# Patient Record
Sex: Female | Born: 1950
Health system: Southern US, Community
[De-identification: ages and names within clinical notes are randomized; demographics above are authoritative.]

## PROBLEM LIST (undated history)

## (undated) DIAGNOSIS — K5790 Diverticulosis of intestine, part unspecified, without perforation or abscess without bleeding: Secondary | ICD-10-CM

## (undated) DIAGNOSIS — R51 Headache: Secondary | ICD-10-CM

## (undated) DIAGNOSIS — Z972 Presence of dental prosthetic device (complete) (partial): Secondary | ICD-10-CM

## (undated) DIAGNOSIS — K219 Gastro-esophageal reflux disease without esophagitis: Secondary | ICD-10-CM

## (undated) DIAGNOSIS — B019 Varicella without complication: Secondary | ICD-10-CM

## (undated) DIAGNOSIS — E785 Hyperlipidemia, unspecified: Secondary | ICD-10-CM

## (undated) DIAGNOSIS — R42 Dizziness and giddiness: Secondary | ICD-10-CM

## (undated) DIAGNOSIS — M1990 Unspecified osteoarthritis, unspecified site: Secondary | ICD-10-CM

## (undated) DIAGNOSIS — I10 Essential (primary) hypertension: Secondary | ICD-10-CM

## (undated) HISTORY — DX: Hyperlipidemia, unspecified: E78.5

## (undated) HISTORY — DX: Headache: R51

## (undated) HISTORY — DX: Gastro-esophageal reflux disease without esophagitis: K21.9

## (undated) HISTORY — PX: OTHER SURGICAL HISTORY: SHX169

## (undated) HISTORY — DX: Essential (primary) hypertension: I10

## (undated) HISTORY — DX: Unspecified osteoarthritis, unspecified site: M19.90

## (undated) HISTORY — PX: BUNIONECTOMY: SHX129

## (undated) HISTORY — DX: Varicella without complication: B01.9

---

## 1997-08-02 HISTORY — PX: ABDOMINAL HYSTERECTOMY: SHX81

## 2002-09-01 HISTORY — PX: HAND SURGERY: SHX662

## 2002-09-18 LAB — HM COLONOSCOPY: HM Colonoscopy: NORMAL

## 2004-11-16 ENCOUNTER — Ambulatory Visit: Payer: Self-pay | Admitting: Unknown Physician Specialty

## 2005-04-23 ENCOUNTER — Other Ambulatory Visit: Payer: Self-pay

## 2005-04-27 ENCOUNTER — Ambulatory Visit: Payer: Self-pay | Admitting: Podiatry

## 2005-06-27 ENCOUNTER — Ambulatory Visit: Payer: Self-pay | Admitting: Unknown Physician Specialty

## 2005-11-20 ENCOUNTER — Ambulatory Visit: Payer: Self-pay | Admitting: Unknown Physician Specialty

## 2005-12-19 ENCOUNTER — Ambulatory Visit: Payer: Self-pay | Admitting: Unknown Physician Specialty

## 2007-02-05 ENCOUNTER — Ambulatory Visit: Payer: Self-pay | Admitting: Unknown Physician Specialty

## 2008-02-11 ENCOUNTER — Ambulatory Visit: Payer: Self-pay | Admitting: Unknown Physician Specialty

## 2009-02-15 ENCOUNTER — Ambulatory Visit: Payer: Self-pay | Admitting: Unknown Physician Specialty

## 2010-08-24 ENCOUNTER — Ambulatory Visit: Payer: Self-pay | Admitting: Unknown Physician Specialty

## 2011-10-16 ENCOUNTER — Ambulatory Visit: Payer: Self-pay | Admitting: Internal Medicine

## 2011-11-16 ENCOUNTER — Encounter: Payer: Self-pay | Admitting: Internal Medicine

## 2011-11-16 ENCOUNTER — Ambulatory Visit (INDEPENDENT_AMBULATORY_CARE_PROVIDER_SITE_OTHER): Payer: No Typology Code available for payment source | Admitting: Internal Medicine

## 2011-11-16 ENCOUNTER — Other Ambulatory Visit (HOSPITAL_COMMUNITY)
Admission: RE | Admit: 2011-11-16 | Discharge: 2011-11-16 | Disposition: A | Discharge status: Home / Self Care | Payer: No Typology Code available for payment source | Source: Ambulatory Visit | Attending: Internal Medicine | Admitting: Internal Medicine

## 2011-11-16 VITALS — BP 142/100 | HR 74 | Temp 98.7°F | Ht 64.75 in | Wt 191.8 lb

## 2011-11-16 DIAGNOSIS — Z1239 Encounter for other screening for malignant neoplasm of breast: Secondary | ICD-10-CM

## 2011-11-16 DIAGNOSIS — D126 Benign neoplasm of colon, unspecified: Secondary | ICD-10-CM

## 2011-11-16 DIAGNOSIS — Z01419 Encounter for gynecological examination (general) (routine) without abnormal findings: Secondary | ICD-10-CM | POA: Insufficient documentation

## 2011-11-16 DIAGNOSIS — Z1151 Encounter for screening for human papillomavirus (HPV): Secondary | ICD-10-CM | POA: Insufficient documentation

## 2011-11-16 DIAGNOSIS — E669 Obesity, unspecified: Secondary | ICD-10-CM

## 2011-11-16 DIAGNOSIS — I10 Essential (primary) hypertension: Secondary | ICD-10-CM

## 2011-11-16 DIAGNOSIS — L259 Unspecified contact dermatitis, unspecified cause: Secondary | ICD-10-CM | POA: Insufficient documentation

## 2011-11-16 DIAGNOSIS — Z0000 Encounter for general adult medical examination without abnormal findings: Secondary | ICD-10-CM

## 2011-11-16 DIAGNOSIS — Z124 Encounter for screening for malignant neoplasm of cervix: Secondary | ICD-10-CM

## 2011-11-16 DIAGNOSIS — K635 Polyp of colon: Secondary | ICD-10-CM | POA: Insufficient documentation

## 2011-11-16 DIAGNOSIS — N760 Acute vaginitis: Secondary | ICD-10-CM | POA: Insufficient documentation

## 2011-11-16 LAB — MICROALBUMIN / CREATININE URINE RATIO: Creatinine,U: 59.8 mg/dL

## 2011-11-16 MED ORDER — LOSARTAN POTASSIUM-HCTZ 50-12.5 MG PO TABS: 1.0000 | ORAL_TABLET | Freq: Every day | ORAL | Status: DC

## 2011-11-16 MED ORDER — TRIAMTERENE-HCTZ 37.5-25 MG PO CAPS: 1.0000 | ORAL_CAPSULE | ORAL | Status: DC

## 2011-11-16 NOTE — Progress Notes (Signed)
Patient ID: Christina Conway, female   DOB: 1950/09/25, 61 y.o.   MRN: [id number]   Subjective:    Christina Conway is a 61 y.o. female who presents for an annual exam. The patient has several complaints today. 1)She had a  right foot bone fracture June 21 occurring at work after tripping on curb.   2)  History of hypertension.  No history of adverse reactions to medications.   3)   Obesity.  She has recently started a Weight Watchers program and has lost 6 pounds in her first week of compliance. 4) new onset pruritic rash behind both ears.   The patient is sexually active. GYN screening history: last pap: was normal and approximate date 2011 and was normal. The patient wears seatbelts: yes. The patient participates in regular exercise: no. Has the patient ever been transfused or tattooed?: no. The patient reports that there is not domestic violence in her life.   Menstrual History: OB History    Grav Para Term Preterm Abortions TAB SAB Ect Mult Living                  Menarche age: 50 No LMP recorded. Patient has had a hysterectomy.    The following portions of the patient's history were reviewed and updated as appropriate: allergies, current medications, past family history, past medical history, past social history, past surgical history and problem list.  Review of Systems A comprehensive review of systems was negative except for: Integument/breast: positive for pruritus and rash    Objective:    BP 142/100  Pulse 74  Temp 98.7 F (37.1 C) (Oral)  Ht 5' 4.75" (1.645 m)  Wt 191 lb 12 oz (86.977 kg)  BMI 32.16 kg/m2  SpO2 98%  General Appearance:    Alert, cooperative, no distress, appears stated age  Head:    Normocephalic, without obvious abnormality, atraumatic  Eyes:    PERRL, conjunctiva/corneas clear, EOM's intact, fundi    benign, both eyes  Ears:    Normal TM's and external ear canals, both ears  Nose:   Nares normal, septum midline, mucosa normal, no drainage    or sinus tenderness    Throat:   Lips, mucosa, and tongue normal; teeth and gums normal  Neck:   Supple, symmetrical, trachea midline, no adenopathy;    thyroid:  no enlargement/tenderness/nodules; no carotid   bruit or JVD  Back:     Symmetric, no curvature, ROM normal, no CVA tenderness  Lungs:     Clear to auscultation bilaterally, respirations unlabored  Chest Wall:    No tenderness or deformity   Heart:    Regular rate and rhythm, S1 and S2 normal, no murmur, rub   or gallop  Breast Exam:    No tenderness, masses, or nipple abnormality  Abdomen:     Soft, non-tender, bowel sounds active all four quadrants,    no masses, no organomegaly  Genitalia:    Normal female without lesion, discharge or tenderness     Extremities:   Extremities normal, atraumatic, no cyanosis or edema  Pulses:   2+ and symmetric all extremities  Skin:   Scaling erythematous patch behind both ears, in contact with earring.   Lymph nodes:   Cervical, supraclavicular, and axillary nodes normal  Neurologic:   CNII-XII intact, normal strength, sensation and reflexes    throughout  .    Assessment:   Contact dermatitis Her rash is consistent with contact dermatitis do to contact with her earrings. Recommended  suspended using any jewelry that is not hypoallergenic and applying cortisone cream twice daily to rash until gone.  Hypertension Not well controlled on current medication. I am switching her to losartan/HCTZ. She will return in one week for basic metabolic panel and other labs.  Routine general medical examination at a health care facility Breast and pelvic were done today. She is status post hysterectomy. Exam was normal. Mammogram ordered.  Obesity (BMI 30-39.9) I have addressed  BMI and recommended a low glycemic index diet utilizing smaller more frequent meals to increase metabolism.  I have also recommended that patient start exercising with a goal of 30 minutes of aerobic exercise a minimum of 5 days per week. Screening  for lipid disorders, thyroid and diabetes to be done next week. She is already lost 6 pounds using Weight Watchers regimen.    Updated Medication List Outpatient Encounter Prescriptions as of 11/16/2011  Medication Sig Dispense Refill  . ALPRAZolam (XANAX) 0.25 MG tablet Take 0.25 mg by mouth at bedtime as needed.      Marland Kitchen losartan-hydrochlorothiazide (HYZAAR) 50-12.5 MG per tablet Take 1 tablet by mouth daily.  90 tablet  3  . DISCONTD: triamterene-hydrochlorothiazide (DYAZIDE) 37.5-25 MG per capsule Take 1 capsule by mouth every morning.      Marland Kitchen DISCONTD: triamterene-hydrochlorothiazide (DYAZIDE) 37.5-25 MG per capsule Take 1 each (1 capsule total) by mouth every morning.  30 capsule  5

## 2011-11-16 NOTE — Patient Instructions (Addendum)
I am witching your bp medication to losartan/hct.  Return in one week for fasting labs  Mammogram to be scheduled  Will will arrange for your repeat colonoscopy with dr Mechele Collin

## 2011-11-18 DIAGNOSIS — E669 Obesity, unspecified: Secondary | ICD-10-CM | POA: Insufficient documentation

## 2011-11-18 NOTE — Assessment & Plan Note (Signed)
I have addressed  BMI and recommended a low glycemic index diet utilizing smaller more frequent meals to increase metabolism.  I have also recommended that patient start exercising with a goal of 30 minutes of aerobic exercise a minimum of 5 days per week. Screening for lipid disorders, thyroid and diabetes to be done next week. She is already lost 6 pounds using Weight Watchers regimen.

## 2011-11-18 NOTE — Assessment & Plan Note (Signed)
Not well controlled on current medication. I am switching her to losartan/HCTZ. She will return in one week for basic metabolic panel and other labs.

## 2011-11-18 NOTE — Assessment & Plan Note (Signed)
Her rash is consistent with contact dermatitis do to contact with her earrings. Recommended suspended using any jewelry that is not hypoallergenic and applying cortisone cream twice daily to rash until gone.

## 2011-11-18 NOTE — Assessment & Plan Note (Addendum)
Breast and pelvic were done today. She is status post hysterectomy. Exam was normal. Mammogram ordered.

## 2011-11-23 ENCOUNTER — Telehealth: Payer: Self-pay | Admitting: *Deleted

## 2011-11-23 ENCOUNTER — Other Ambulatory Visit (INDEPENDENT_AMBULATORY_CARE_PROVIDER_SITE_OTHER): Payer: No Typology Code available for payment source | Admitting: *Deleted

## 2011-11-23 DIAGNOSIS — I10 Essential (primary) hypertension: Secondary | ICD-10-CM

## 2011-11-23 DIAGNOSIS — E785 Hyperlipidemia, unspecified: Secondary | ICD-10-CM

## 2011-11-23 DIAGNOSIS — Z79899 Other long term (current) drug therapy: Secondary | ICD-10-CM

## 2011-11-23 LAB — HEMOGLOBIN A1C: Hgb A1c MFr Bld: 5.7 % (ref 4.6–6.5)

## 2011-11-23 LAB — COMPREHENSIVE METABOLIC PANEL
AST: 25 U/L (ref 0–37)
Alkaline Phosphatase: 78 U/L (ref 39–117)
Glucose, Bld: 80 mg/dL (ref 70–99)
Sodium: 138 mEq/L (ref 135–145)
Total Bilirubin: 0.6 mg/dL (ref 0.3–1.2)
Total Protein: 7.3 g/dL (ref 6.0–8.3)

## 2011-11-23 LAB — LIPID PANEL
Cholesterol: 206 mg/dL — ABNORMAL HIGH (ref 0–200)
HDL: 49.8 mg/dL (ref >39.00)
Total CHOL/HDL Ratio: 4
VLDL: 46.8 mg/dL — ABNORMAL HIGH (ref 0.0–40.0)

## 2011-11-23 LAB — TSH: TSH: 1.24 u[IU]/mL (ref 0.35–5.50)

## 2011-11-23 NOTE — Telephone Encounter (Signed)
Patient came in for labs today, what do you want me to order?

## 2011-11-23 NOTE — Telephone Encounter (Signed)
FAsting lipids, TSH,.  CMEt,  And Hgba1c

## 2011-11-29 ENCOUNTER — Other Ambulatory Visit: Payer: Self-pay | Admitting: Internal Medicine

## 2011-11-29 MED ORDER — POTASSIUM CHLORIDE ER 10 MEQ PO TBCR: 10.0000 meq | EXTENDED_RELEASE_TABLET | Freq: Every day | ORAL | Status: DC

## 2011-12-04 ENCOUNTER — Encounter: Payer: Self-pay | Admitting: Internal Medicine

## 2011-12-06 ENCOUNTER — Telehealth: Payer: Self-pay | Admitting: Internal Medicine

## 2011-12-06 ENCOUNTER — Emergency Department: Payer: Self-pay | Admitting: Unknown Physician Specialty

## 2011-12-06 NOTE — Telephone Encounter (Signed)
Pt called wanting an appointment as soon as possible she has a boil on her private area.  She said it started yesterday.  She said today it is bigger and has a purple head and very painful Appointment Friday 8/23 @ 2:45.

## 2011-12-06 NOTE — Telephone Encounter (Signed)
Pt called back she is going to go to urgent care.  She stated she could not wait until tomorrow

## 2011-12-07 ENCOUNTER — Ambulatory Visit: Payer: No Typology Code available for payment source | Admitting: Internal Medicine

## 2012-03-11 ENCOUNTER — Telehealth: Payer: Self-pay | Admitting: Internal Medicine

## 2012-03-11 NOTE — Telephone Encounter (Signed)
Caller: Alleah/Patient; Phone: [phone]; Reason for Call: Has billing question.

## 2012-03-12 NOTE — Telephone Encounter (Signed)
Received voice mail from patient asking me to look into her bill for August 2nd and 9th. I have emailed Anabell to see if they were coded correctly or not. I also called patient to advise her that I had sent email.

## 2012-05-07 NOTE — Telephone Encounter (Signed)
I have sent another email to Christina Conway asking her for the status of this patient's bill since it still looks as if nothing has been done.

## 2012-05-14 ENCOUNTER — Other Ambulatory Visit: Payer: Self-pay | Admitting: Internal Medicine

## 2012-05-19 ENCOUNTER — Ambulatory Visit: Payer: No Typology Code available for payment source | Admitting: Internal Medicine

## 2012-05-21 ENCOUNTER — Encounter: Payer: Self-pay | Admitting: Internal Medicine

## 2012-05-21 ENCOUNTER — Ambulatory Visit (INDEPENDENT_AMBULATORY_CARE_PROVIDER_SITE_OTHER): Payer: No Typology Code available for payment source | Admitting: Internal Medicine

## 2012-05-21 VITALS — BP 128/84 | HR 77 | Temp 98.0°F | Resp 16 | Wt 195.5 lb

## 2012-05-21 DIAGNOSIS — Z79899 Other long term (current) drug therapy: Secondary | ICD-10-CM

## 2012-05-21 DIAGNOSIS — I10 Essential (primary) hypertension: Secondary | ICD-10-CM

## 2012-05-21 DIAGNOSIS — D126 Benign neoplasm of colon, unspecified: Secondary | ICD-10-CM

## 2012-05-21 DIAGNOSIS — E669 Obesity, unspecified: Secondary | ICD-10-CM

## 2012-05-21 DIAGNOSIS — K635 Polyp of colon: Secondary | ICD-10-CM

## 2012-05-21 DIAGNOSIS — Z1211 Encounter for screening for malignant neoplasm of colon: Secondary | ICD-10-CM

## 2012-05-21 LAB — BASIC METABOLIC PANEL
BUN: 27 mg/dL — ABNORMAL HIGH (ref 6–23)
Chloride: 102 mEq/L (ref 96–112)
Potassium: 3.9 mEq/L (ref 3.5–5.1)

## 2012-05-21 NOTE — Progress Notes (Addendum)
Patient ID: Christina Conway, female   DOB: November 27, 1950, 62 y.o.   MRN: [id number]   Patient Active Problem List  Diagnosis  . Routine general medical examination at a health care facility  . Hypertension  . Contact dermatitis  . Benign colonic polyp  . Obesity (BMI 30-39.9)    Subjective:  CC:   Chief Complaint  Patient presents with  . Follow-up    HPI:   Christina Conway a 62 y.o. female who presents 6 month follow up on hypertension and obesity .  She feels fine, is taking her medications as instructed and is having no side effects. She has gained weight over the holidays and is not exercising or following the Weight Watchers program. She denies chest pain, shortness of breath and fatigue.      Past Medical History  Diagnosis Date  . Arthritis   . Chicken pox   . Headache   . Hypertension     Past Surgical History  Procedure Date  . Bunionectomy 06/03/1998 & 06/04/2003  . Hand surgery 09/01/2002    righ hand  . Abdominal hysterectomy 08/02/1997  . Bunion          The following portions of the patient's history were reviewed and updated as appropriate: Allergies, current medications, and problem list.    Review of Systems:   12 Pt  review of systems was negative except those addressed in the HPI,     History   Social History  . Marital Status: Married    Spouse Name: N/A    Number of Children: N/A  . Years of Education: N/A   Occupational History  . Not on file.   Social History Main Topics  . Smoking status: Never Smoker   . Smokeless tobacco: Not on file  . Alcohol Use: No  . Drug Use: No  . Sexually Active: Not on file   Other Topics Concern  . Not on file   Social History Narrative  . No narrative on file    Objective:  BP 128/84  Pulse 77  Temp 98 F (36.7 C) (Oral)  Resp 16  Wt 195 lb 8 oz (88.678 kg)  SpO2 99%  General appearance: alert, cooperative and appears stated age Ears: normal TM's and external ear canals both  ears Throat: lips, mucosa, and tongue normal; teeth and gums normal Neck: no adenopathy, no carotid bruit, supple, symmetrical, trachea midline and thyroid not enlarged, symmetric, no tenderness/mass/nodules Back: symmetric, no curvature. ROM normal. No CVA tenderness. Lungs: clear to auscultation bilaterally Heart: regular rate and rhythm, S1, S2 normal, no murmur, click, rub or gallop Abdomen: soft, non-tender; bowel sounds normal; no masses,  no organomegaly Pulses: 2+ and symmetric Skin: Skin color, texture, turgor normal. No rashes or lesions Lymph nodes: Cervical, supraclavicular, and axillary nodes normal.  Assessment and Plan:  Hypertension Well controlled on current regimen. Renal function stable, no changes today.  Obesity (BMI 30-39.9) I have addressed  BMI and recommended a low glycemic index diet utilizing smaller more frequent meals to increase metabolism.  I have also recommended that patient start exercising with a goal of 30 minutes of aerobic exercise a minimum of 5 days per week.  25 minutes was spent in face to face time, over 50% of which was spent in counseling .    Benign colonic polyp She is due for  follow up colonoscopy. With Dr. Mechele Collin.  Referral made.     Updated Medication List Outpatient Encounter Prescriptions as of 05/21/2012  Medication Sig Dispense Refill  . losartan-hydrochlorothiazide (HYZAAR) 50-12.5 MG per tablet Take 1 tablet by mouth daily.  90 tablet  3  . triamterene-hydrochlorothiazide (DYAZIDE) 37.5-25 MG per capsule TAKE 1 EACH (1 CAPSULE TOTAL) BY MOUTH EVERY MORNING.  30 capsule  5  . ALPRAZolam (XANAX) 0.25 MG tablet Take 0.25 mg by mouth at bedtime as needed.      . potassium chloride (K-DUR) 10 MEQ tablet Take 1 tablet (10 mEq total) by mouth daily.  30 tablet  3

## 2012-05-21 NOTE — Assessment & Plan Note (Signed)
She is due for  follow up colonoscopy. With Dr. Mechele Collin.  Referral made.

## 2012-05-21 NOTE — Patient Instructions (Addendum)
Return in 6 month for your annual physical with fasting labs prior or same day  Referral for colonoscopy underway  This is  my version of a  "Low GI"  Diet:  It is not ultra low carb, but will still lower your blood sugars and allow you to lose 5 to 10 lbs per month if you follow it carefully. All of the foods can be found at grocery stores and in bulk at [employer].  The Atkins protein bars and shakes are available in more varieties at [employer], [employer] and [employer].     7 AM Breakfast:  Low carbohydrate Protein  Shakes (I recommend the [employer] AdvantEdge "Carb Control" shakes  Or the low carb shakes by Atkins.   Both are available everywhere:  In  cases at [employer]  Or in 4 packs at grocery stores and pharmacies  2.5 carbs  (Alternative is  a toasted Arnold's Sandwhich Thin w/ peanut butter, a "Bagel Thin" with cream cheese and salmon) or  a scrambled egg burrito made with a low carb tortilla .  Avoid cereal and bananas, oatmeal too unless you are cooking the old fashioned kind that takes 30-40 minutes to prepare.  the rest is overly processed, has minimal fiber, and is loaded with carbohydrates!   10 AM: Protein bar by Atkins (the snack size, under 200 cal).  There are many varieties , available widely again or in bulk in limited varieties at [employer]Js)  Other so called "protein bars" tend to be loaded with carbohydrates.  Remember, in food advertising, the word "energy" is synonymous for " carbohydrate."  Lunch: sandwich of [place], (or any lunchmeat, grilled meat or canned tuna), fresh avocado, mayonnaise  and cheese on a lower carbohydrate pita bread, flatbread, or tortilla . Ok to use regular mayonnaise. The bread is the only source or carbohydrate that can be decreased (Joseph's makes a pita bread and a flat bread that are 50 cal and 4 net carbs ; Toufayan makes a low carb flatbread that's 100 cal and 9 net carbs  and  [place] makes a low carb whole wheat tortilla  That is 210 cal and 6 net carbs)  3 PM:   Mid day :  Another protein bar,  Or a  cheese stick (100 cal, 0 carbs),  Or 1 ounce of  almonds, walnuts, pistachios, pecans, peanuts,  Macadamia nuts. Or a Dannon light n Fit greek yogurt, 80 cal 8 net carbs . Avoid "granola"; the dried cranberries and raisins are loaded with carbohydrates. Mixed nuts ok if no raisins or cranberries or dried fruit.      6 PM  Dinner:  "mean and green:"  Meat/chicken/fish or a high protein legume; , with a green salad, and a low GI  Veggie (broccoli, cauliflower, green beans, spinach, brussel sprouts. Lima beans) : Avoid "Low fat dressings, as well as Reyne Dumas and [address]! They are loaded with sugar! Instead use ranch, vinagrette,  Blue cheese, etc.  There is a low carb pasta by Dreamfield's available at [facility] that is acceptable and tastes great. Try Michel Angel's chicken piccata over low carb pasta. The chicken dish is 0 carbs, and can be found in frozen section at [employer]Js and [employer]. Also try [employer]" (pulled pork, no sauce,  0 carbs) and his pot roast.   both are in the refrigerated section at [employer]Js   Dreamfield's makes a low carb pasta only 5 g/serving.  Available at all grocery stores,  And tastes like normal pasta  9 PM snack : Breyer's "low carb" fudgsicle or  ice cream bar (Carb Smart line), or  Weight Watcher's ice cream bar , or another "no sugar added" ice cream;a serving of fresh berries/cherries with whipped cream (Avoid bananas, pineapple, grapes  and watermelon on a regular basis because they are high in sugar)   Remember that snack Substitutions should be less than 10 carbs per serving and meals < 20 carbs. Remember to subtract fiber grams and sugar alcohols to get the "net carbs."

## 2012-05-21 NOTE — Assessment & Plan Note (Signed)
Well controlled on current regimen. Renal function stable, no changes today. 

## 2012-05-21 NOTE — Assessment & Plan Note (Signed)
I have addressed  BMI and recommended a low glycemic index diet utilizing smaller more frequent meals to increase metabolism.  I have also recommended that patient start exercising with a goal of 30 minutes of aerobic exercise a minimum of 5 days per week.  25 minutes was spent in face to face time, over 50% of which was spent in counseling .

## 2012-05-26 ENCOUNTER — Encounter: Payer: Self-pay | Admitting: Internal Medicine

## 2012-05-27 NOTE — Telephone Encounter (Signed)
Have you received any information about this colonoscopy?

## 2012-05-31 ENCOUNTER — Other Ambulatory Visit: Payer: Self-pay

## 2012-06-19 ENCOUNTER — Encounter: Payer: Self-pay | Admitting: Internal Medicine

## 2012-08-29 ENCOUNTER — Other Ambulatory Visit: Payer: Self-pay | Admitting: Internal Medicine

## 2012-08-29 NOTE — Telephone Encounter (Signed)
Pharmacy sent refill for Triam/HCTZ. I called pt to clarify what BP med she has been taking. In OV 11/2011, note mentioned changing from Triam/HCTZ to Losartan HCT. Pt states she has been taking both since that visit. Is this what you would like her to be doing, pt requests call back to clarify. States has had no issues taking both meds, no dizziness.

## 2012-09-03 NOTE — Telephone Encounter (Signed)
Pt called, wanting clarification on her blood pressure medicines. She had requested refill for Triam/HCTZ. When I looked at your 8/13 note, you stated she was switching to Losartan HCT and Triam/HCTZ was discontinued. However, pt has been taking both since that time. Which medicine should she be taking, or both?

## 2012-09-04 ENCOUNTER — Other Ambulatory Visit: Payer: Self-pay | Admitting: *Deleted

## 2012-09-04 NOTE — Telephone Encounter (Signed)
Called and advised pt that she should only be taking Losartan HCT and to discontinue the Triam/HCTZ, verbalized understanding.

## 2012-09-15 ENCOUNTER — Telehealth: Payer: Self-pay | Admitting: *Deleted

## 2012-09-15 MED ORDER — LOSARTAN POTASSIUM-HCTZ 50-12.5 MG PO TABS: 2.0000 | ORAL_TABLET | Freq: Every day | ORAL | Status: DC

## 2012-09-15 NOTE — Telephone Encounter (Signed)
Patient reports called office two weeks ago and was told to stop Triamterene -HCTZ 37.5 since then patient report BP increase to 157/97. Patient is concerned should she take the Triamterene- hctz since BP has increased without it. Please advise.

## 2012-09-15 NOTE — Telephone Encounter (Signed)
No, have her double the losartan/hct dose,  Instead of resuming triamterene htctz

## 2012-09-16 NOTE — Telephone Encounter (Signed)
Patient notified to double dose of Losartan/hctz.

## 2012-11-16 ENCOUNTER — Other Ambulatory Visit: Payer: Self-pay | Admitting: Internal Medicine

## 2012-11-21 ENCOUNTER — Encounter: Payer: No Typology Code available for payment source | Admitting: Internal Medicine

## 2012-12-08 ENCOUNTER — Ambulatory Visit: Payer: Self-pay | Admitting: Family Medicine

## 2013-01-27 LAB — HM COLONOSCOPY

## 2013-02-03 ENCOUNTER — Other Ambulatory Visit: Payer: Self-pay | Admitting: Internal Medicine

## 2013-02-19 ENCOUNTER — Other Ambulatory Visit: Payer: Self-pay

## 2013-03-13 ENCOUNTER — Other Ambulatory Visit: Payer: Self-pay | Admitting: Internal Medicine

## 2014-01-12 ENCOUNTER — Ambulatory Visit: Payer: Self-pay | Admitting: Family Medicine

## 2014-11-24 ENCOUNTER — Encounter: Payer: Self-pay | Admitting: Physician Assistant

## 2014-11-24 ENCOUNTER — Ambulatory Visit (INDEPENDENT_AMBULATORY_CARE_PROVIDER_SITE_OTHER): Payer: Managed Care, Other (non HMO) | Admitting: Physician Assistant

## 2014-11-24 VITALS — BP 120/80 | HR 68 | Temp 98.9°F | Resp 16 | Ht 65.0 in | Wt 204.8 lb

## 2014-11-24 DIAGNOSIS — Z833 Family history of diabetes mellitus: Secondary | ICD-10-CM

## 2014-11-24 DIAGNOSIS — Z124 Encounter for screening for malignant neoplasm of cervix: Secondary | ICD-10-CM

## 2014-11-24 DIAGNOSIS — Z0000 Encounter for general adult medical examination without abnormal findings: Secondary | ICD-10-CM | POA: Diagnosis not present

## 2014-11-24 DIAGNOSIS — Z8639 Personal history of other endocrine, nutritional and metabolic disease: Secondary | ICD-10-CM

## 2014-11-24 DIAGNOSIS — Z1239 Encounter for other screening for malignant neoplasm of breast: Secondary | ICD-10-CM | POA: Diagnosis not present

## 2014-11-24 NOTE — Patient Instructions (Signed)
Health Maintenance Adopting a healthy lifestyle and getting preventive care can go a long way to promote health and wellness. Talk with your health care provider about what schedule of regular examinations is right for you. This is a good chance for you to check in with your provider about disease prevention and staying healthy. In between checkups, there are plenty of things you can do on your own. Experts have done a lot of research about which lifestyle changes and preventive measures are most likely to keep you healthy. Ask your health care provider for more information. WEIGHT AND DIET  Eat a healthy diet 1. Be sure to include plenty of vegetables, fruits, low-fat dairy products, and lean protein. 2. Do not eat a lot of foods high in solid fats, added sugars, or salt. 3. Get regular exercise. This is one of the most important things you can do for your health. 1. Most adults should exercise for at least 150 minutes each week. The exercise should increase your heart rate and make you sweat (moderate-intensity exercise). 2. Most adults should also do strengthening exercises at least twice a week. This is in addition to the moderate-intensity exercise.  Maintain a healthy weight 1. Body mass index (BMI) is a measurement that can be used to identify possible weight problems. It estimates body fat based on height and weight. Your health care provider can help determine your BMI and help you achieve or maintain a healthy weight. 2. For females 49 years of age and older:  1. A BMI below 18.5 is considered underweight. 2. A BMI of 18.5 to 24.9 is normal. 3. A BMI of 25 to 29.9 is considered overweight. 4. A BMI of 30 and above is considered obese.  Watch levels of cholesterol and blood lipids 1. You should start having your blood tested for lipids and cholesterol at 62 years of age, then have this test every 5 years. 2. You may need to have your cholesterol levels checked more often if: 1. Your  lipid or cholesterol levels are high. 2. You are older than 62 years of age. 3. You are at high risk for heart disease.  CANCER SCREENING   Lung Cancer 1. Lung cancer screening is recommended for adults 62-62 years old who are at high risk for lung cancer because of a history of smoking. 2. A yearly low-dose CT scan of the lungs is recommended for people who: 1. Currently smoke. 2. Have quit within the past 15 years. 3. Have at least a 30-pack-year history of smoking. A pack year is smoking an average of one pack of cigarettes a day for 1 year. 3. Yearly screening should continue until it has been 15 years since you quit. 4. Yearly screening should stop if you develop a health problem that would prevent you from having lung cancer treatment.  Breast Cancer  Practice breast self-awareness. This means understanding how your breasts normally appear and feel.  It also means doing regular breast self-exams. Let your health care provider know about any changes, no matter how small.  If you are in your 20s or 30s, you should have a clinical breast exam (CBE) by a health care provider every 1-3 years as part of a regular health exam.  If you are 55 or older, have a CBE every year. Also consider having a breast X-ray (mammogram) every year.  If you have a family history of breast cancer, talk to your health care provider about genetic screening.  If you are  at high risk for breast cancer, talk to your health care provider about having an MRI and a mammogram every year.  Breast cancer gene (BRCA) assessment is recommended for women who have family members with BRCA-related cancers. BRCA-related cancers include:  Breast.  Ovarian.  Tubal.  Peritoneal cancers.  Results of the assessment will determine the need for genetic counseling and BRCA1 and BRCA2 testing. Cervical Cancer Routine pelvic examinations to screen for cervical cancer are no longer recommended for nonpregnant women who  are considered low risk for cancer of the pelvic organs (ovaries, uterus, and vagina) and who do not have symptoms. A pelvic examination may be necessary if you have symptoms including those associated with pelvic infections. Ask your health care provider if a screening pelvic exam is right for you.   The Pap test is the screening test for cervical cancer for women who are considered at risk.  If you had a hysterectomy for a problem that was not cancer or a condition that could lead to cancer, then you no longer need Pap tests.  If you are older than 65 years, and you have had normal Pap tests for the past 10 years, you no longer need to have Pap tests.  If you have had past treatment for cervical cancer or a condition that could lead to cancer, you need Pap tests and screening for cancer for at least 20 years after your treatment.  If you no longer get a Pap test, assess your risk factors if they change (such as having a new sexual partner). This can affect whether you should start being screened again.  Some women have medical problems that increase their chance of getting cervical cancer. If this is the case for you, your health care provider may recommend more frequent screening and Pap tests.  The human papillomavirus (HPV) test is another test that may be used for cervical cancer screening. The HPV test looks for the virus that can cause cell changes in the cervix. The cells collected during the Pap test can be tested for HPV.  The HPV test can be used to screen women 49 years of age and older. Getting tested for HPV can extend the interval between normal Pap tests from three to five years.  An HPV test also should be used to screen women of any age who have unclear Pap test results.  After 62 years of age, women should have HPV testing as often as Pap tests.  Colorectal Cancer  This type of cancer can be detected and often prevented.  Routine colorectal cancer screening usually  begins at 62 years of age and continues through 62 years of age.  Your health care provider may recommend screening at an earlier age if you have risk factors for colon cancer.  Your health care provider may also recommend using home test kits to check for hidden blood in the stool.  A small camera at the end of a tube can be used to examine your colon directly (sigmoidoscopy or colonoscopy). This is done to check for the earliest forms of colorectal cancer.  Routine screening usually begins at age 19.  Direct examination of the colon should be repeated every 5-10 years through 62 years of age. However, you may need to be screened more often if early forms of precancerous polyps or small growths are found. Skin Cancer  Check your skin from head to toe regularly.  Tell your health care provider about any new moles or changes in  moles, especially if there is a change in a mole's shape or color.  Also tell your health care provider if you have a mole that is larger than the size of a pencil eraser.  Always use sunscreen. Apply sunscreen liberally and repeatedly throughout the day.  Protect yourself by wearing long sleeves, pants, a wide-brimmed hat, and sunglasses whenever you are outside. HEART DISEASE, DIABETES, AND HIGH BLOOD PRESSURE   Have your blood pressure checked at least every 1-2 years. High blood pressure causes heart disease and increases the risk of stroke.  If you are between 32 years and 30 years old, ask your health care provider if you should take aspirin to prevent strokes.  Have regular diabetes screenings. This involves taking a blood sample to check your fasting blood sugar level.  If you are at a normal weight and have a low risk for diabetes, have this test once every three years after 62 years of age.  If you are overweight and have a high risk for diabetes, consider being tested at a younger age or more often. PREVENTING INFECTION  Hepatitis B  If you have a  higher risk for hepatitis B, you should be screened for this virus. You are considered at high risk for hepatitis B if:  You were born in a country where hepatitis B is common. Ask your health care provider which countries are considered high risk.  Your parents were born in a high-risk country, and you have not been immunized against hepatitis B (hepatitis B vaccine).  You have HIV or AIDS.  You use needles to inject street drugs.  You live with someone who has hepatitis B.  You have had sex with someone who has hepatitis B.  You get hemodialysis treatment.  You take certain medicines for conditions, including cancer, organ transplantation, and autoimmune conditions. Hepatitis C  Blood testing is recommended for:  Everyone born from [birth year] through 1963.  Anyone with known risk factors for hepatitis C. Sexually transmitted infections (STIs)  You should be screened for sexually transmitted infections (STIs) including gonorrhea and chlamydia if:  You are sexually active and are younger than 62 years of age.  You are older than 62 years of age and your health care provider tells you that you are at risk for this type of infection.  Your sexual activity has changed since you were last screened and you are at an increased risk for chlamydia or gonorrhea. Ask your health care provider if you are at risk.  If you do not have HIV, but are at risk, it may be recommended that you take a prescription medicine daily to prevent HIV infection. This is called pre-exposure prophylaxis (PrEP). You are considered at risk if:  You are sexually active and do not regularly use condoms or know the HIV status of your partner(s).  You take drugs by injection.  You are sexually active with a partner who has HIV. Talk with your health care provider about whether you are at high risk of being infected with HIV. If you choose to begin PrEP, you should first be tested for HIV. You should then be tested  every 3 months for as long as you are taking PrEP.  PREGNANCY   If you are premenopausal and you may become pregnant, ask your health care provider about preconception counseling.  If you may become pregnant, take 400 to 800 micrograms (mcg) of folic acid every day.  If you want to prevent pregnancy, talk to your  health care provider about birth control (contraception). OSTEOPOROSIS AND MENOPAUSE   Osteoporosis is a disease in which the bones lose minerals and strength with aging. This can result in serious bone fractures. Your risk for osteoporosis can be identified using a bone density scan.  If you are 46 years of age or older, or if you are at risk for osteoporosis and fractures, ask your health care provider if you should be screened.  Ask your health care provider whether you should take a calcium or vitamin D supplement to lower your risk for osteoporosis.  Menopause may have certain physical symptoms and risks.  Hormone replacement therapy may reduce some of these symptoms and risks. Talk to your health care provider about whether hormone replacement therapy is right for you.  HOME CARE INSTRUCTIONS   Schedule regular health, dental, and eye exams.  Stay current with your immunizations.   Do not use any tobacco products including cigarettes, chewing tobacco, or electronic cigarettes.  If you are pregnant, do not drink alcohol.  If you are breastfeeding, limit how much and how often you drink alcohol.  Limit alcohol intake to no more than 1 drink per day for nonpregnant women. One drink equals 12 ounces of beer, 5 ounces of wine, or 1 ounces of hard liquor.  Do not use street drugs.  Do not share needles.  Ask your health care provider for help if you need support or information about quitting drugs.  Tell your health care provider if you often feel depressed.  Tell your health care provider if you have ever been abused or do not feel safe at home. Document  Released: 10/16/2010 Document Revised: 08/17/2013 Document Reviewed: 03/04/2013 [facility] Patient Information 2015 [place], [place]. This information is not intended to replace advice given to you by your health care provider. Make sure you discuss any questions you have with your health care provider.     Why follow it? Research shows. . Those who follow the Mediterranean diet have a reduced risk of heart disease  . The diet is associated with a reduced incidence of Parkinson's and Alzheimer's diseases . People following the diet may have longer life expectancies and lower rates of chronic diseases  . The Dietary Guidelines for Americans recommends the Mediterranean diet as an eating plan to promote health and prevent disease  What Is the Mediterranean Diet?  . Healthy eating plan based on typical foods and recipes of Mediterranean-style cooking . The diet is primarily a plant based diet; these foods should make up a majority of meals   Starches - Plant based foods should make up a majority of meals - They are an important sources of vitamins, minerals, energy, antioxidants, and fiber - Choose whole grains, foods high in fiber and minimally processed items  - Typical grain sources include wheat, oats, barley, corn, brown rice, bulgar, farro, millet, polenta, couscous  - Various types of beans include chickpeas, lentils, fava beans, black beans, white beans   Fruits  Veggies - Large quantities of antioxidant rich fruits & veggies; 6 or more servings  - Vegetables can be eaten raw or lightly drizzled with oil and cooked  - Vegetables common to the traditional Mediterranean Diet include: artichokes, arugula, beets, broccoli, brussel sprouts, cabbage, carrots, celery, collard greens, cucumbers, eggplant, kale, leeks, lemons, lettuce, mushrooms, okra, onions, peas, peppers, potatoes, pumpkin, radishes, rutabaga, shallots, spinach, sweet potatoes, turnips, zucchini - Fruits common to the Mediterranean  Diet include: apples, apricots, avocados, cherries, clementines, dates, figs, grapefruits,  grapes, melons, nectarines, oranges, peaches, pears, pomegranates, strawberries, tangerines  Fats - Replace butter and margarine with healthy oils, such as olive oil, canola oil, and tahini  - Limit nuts to no more than a handful a day  - Nuts include walnuts, almonds, pecans, pistachios, pine nuts  - Limit or avoid candied, honey roasted or heavily salted nuts - Olives are central to the Mediterranean diet - can be eaten whole or used in a variety of dishes   Meats Protein - Limiting red meat: no more than a few times a month - When eating red meat: choose lean cuts and keep the portion to the size of deck of cards - Eggs: approx. 0 to 4 times a week  - Fish and lean poultry: at least 2 a week  - Healthy protein sources include, chicken, [place], lean beef, lamb - Increase intake of seafood such as tuna, salmon, trout, mackerel, shrimp, scallops - Avoid or limit high fat processed meats such as sausage and bacon  Dairy - Include moderate amounts of low fat dairy products  - Focus on healthy dairy such as fat free yogurt, skim milk, low or reduced fat cheese - Limit dairy products higher in fat such as whole or 2% milk, cheese, ice cream  Alcohol - Moderate amounts of red wine is ok  - No more than 5 oz daily for women (all ages) and men older than age 74  - No more than 10 oz of wine daily for men younger than 44  Other - Limit sweets and other desserts  - Use herbs and spices instead of salt to flavor foods  - Herbs and spices common to the traditional Mediterranean Diet include: basil, bay leaves, chives, cloves, cumin, fennel, garlic, lavender, marjoram, mint, oregano, parsley, pepper, rosemary, sage, savory, sumac, tarragon, thyme   It's not just a diet, it's a lifestyle:  . The Mediterranean diet includes lifestyle factors typical of those in the region  . Foods, drinks and meals are best eaten  with others and savored . Daily physical activity is important for overall good health . This could be strenuous exercise like running and aerobics . This could also be more leisurely activities such as walking, housework, yard-work, or taking the stairs . Moderation is the key; a balanced and healthy diet accommodates most foods and drinks . Consider portion sizes and frequency of consumption of certain foods   Meal Ideas & Options:  . Breakfast:  o Whole wheat toast or whole wheat English muffins with peanut butter & hard boiled egg o Steel cut oats topped with apples & cinnamon and skim milk  o Fresh fruit: banana, strawberries, melon, berries, peaches  o Smoothies: strawberries, bananas, greek yogurt, peanut butter o Low fat greek yogurt with blueberries and granola  o Egg white omelet with spinach and mushrooms o Breakfast couscous: whole wheat couscous, apricots, skim milk, cranberries  . Sandwiches:  o Hummus and grilled vegetables (peppers, zucchini, squash) on whole wheat bread   o Grilled chicken on whole wheat pita with lettuce, tomatoes, cucumbers or tzatziki  o Tuna salad on whole wheat bread: tuna salad made with greek yogurt, olives, red peppers, capers, green onions o Garlic rosemary lamb pita: lamb sauted with garlic, rosemary, salt & pepper; add lettuce, cucumber, greek yogurt to pita - flavor with lemon juice and black pepper  . Seafood:  o Mediterranean grilled salmon, seasoned with garlic, basil, parsley, lemon juice and black pepper o Shrimp, lemon, and spinach  whole-grain pasta salad made with low fat greek yogurt  o Seared scallops with lemon orzo  o Seared tuna steaks seasoned salt, pepper, coriander topped with tomato mixture of olives, tomatoes, olive oil, minced garlic, parsley, green onions and cappers  . Meats:  o Herbed greek chicken salad with kalamata olives, cucumber, feta  o Red bell peppers stuffed with spinach, bulgur, lean ground beef (or lentils) &  topped with feta   o Kebabs: skewers of chicken, tomatoes, onions, zucchini, squash  o [place] burgers: made with red onions, mint, dill, lemon juice, feta cheese topped with roasted red peppers . Vegetarian o Cucumber salad: cucumbers, artichoke hearts, celery, red onion, feta cheese, tossed in olive oil & lemon juice  o Hummus and whole grain pita points with a greek salad (lettuce, tomato, feta, olives, cucumbers, red onion) o Lentil soup with celery, carrots made with vegetable broth, garlic, salt and pepper  o Tabouli salad: parsley, bulgur, mint, scallions, cucumbers, tomato, radishes, lemon juice, olive oil, salt and pepper.      American Heart Association (AHA) Exercise Recommendation  Being physically active is important to prevent heart disease and stroke, the nation's No. 1and No. 5killers. To improve overall cardiovascular health, we suggest at least 150 minutes per week of moderate exercise or 75 minutes per week of vigorous exercise (or a combination of moderate and vigorous activity). Thirty minutes a day, five times a week is an easy goal to remember. You will also experience benefits even if you divide your time into two or three segments of 10 to 15 minutes per day.  For people who would benefit from lowering their blood pressure or cholesterol, we recommend 40 minutes of aerobic exercise of moderate to vigorous intensity three to four times a week to lower the risk for heart attack and stroke.  Physical activity is anything that makes you move your body and burn calories.  This includes things like climbing stairs or playing sports. Aerobic exercises benefit your heart, and include walking, jogging, swimming or biking. Strength and stretching exercises are best for overall stamina and flexibility.  The simplest, positive change you can make to effectively improve your heart health is to start walking. It's enjoyable, free, easy, social and great exercise. A walking program is  flexible and boasts high success rates because people can stick with it. It's easy for walking to become a regular and satisfying part of life.   For Overall Cardiovascular Health:  At least 30 minutes of moderate-intensity aerobic activity at least 5 days per week for a total of 150  OR   At least 25 minutes of vigorous aerobic activity at least 3 days per week for a total of 75 minutes; or a combination of moderate- and vigorous-intensity aerobic activity  AND   Moderate- to high-intensity muscle-strengthening activity at least 2 days per week for additional health benefits.  For Lowering Blood Pressure and Cholesterol  An average 40 minutes of moderate- to vigorous-intensity aerobic activity 3 or 4 times per week  What if I can't make it to the time goal? Something is always better than nothing! And everyone has to start somewhere. Even if you've been sedentary for years, today is the day you can begin to make healthy changes in your life. If you don't think you'll make it for 30 or 40 minutes, set a reachable goal for today. You can work up toward your overall goal by increasing your time as you get stronger. Don't let all-or-nothing thinking  rob you of doing what you can every day.  Source:http://www.heart.org

## 2014-11-24 NOTE — Progress Notes (Signed)
Patient: Christina Conway, Female    DOB: 07/25/1950, 64 y.o.   MRN: [id number] Visit Date: 11/24/2014  Today's Provider: Mar Daring, PA-C   Chief Complaint  Patient presents with  . Annual Exam  . Establish Care   Subjective:    Annual physical exam Christina Conway is a 64 y.o. female who presents today for health maintenance and complete physical. She feels well. She reports not exercising. She reports she is sleeping well. Last Pap Smear was back in 2013. Mammogram was Normal 11/2013, last bone density was in 2013. and was Normal.  She is doing fairly well and has no complaints today.  She does have hypertension and is on Hyzaar and Dyazide.  She was put on these by Dr. Derrel Nip.  Will check labs and see if it is necessary to have both with HCTZ (high dose HCTZ).    Review of Systems  Constitutional: Negative.   HENT: Negative.   Eyes: Negative.   Respiratory: Negative.   Cardiovascular: Negative.   Gastrointestinal: Negative.   Endocrine: Negative.   Genitourinary: Negative.   Musculoskeletal: Positive for arthralgias.  Skin: Negative.   Allergic/Immunologic: Negative.   Neurological: Negative.   Hematological: Negative.   Psychiatric/Behavioral: Negative.     Social History She  reports that she has never smoked. She does not have any smokeless tobacco history on file. She reports that she does not drink alcohol or use illicit drugs.  Patient Active Problem List   Diagnosis Date Noted  . Obesity (BMI 30-39.9) 11/18/2011  . Routine general medical examination at a health care facility 11/16/2011  . Hypertension 11/16/2011  . Contact dermatitis 11/16/2011  . Benign colonic polyp 11/16/2011    Past Surgical History  Procedure Laterality Date  . Bunionectomy  06/03/1998 & 06/04/2003  . Hand surgery  09/01/2002    righ hand  . Bunion    . Abdominal hysterectomy  08/02/1997    Family History  Family Status  Relation Status Death Age  . Mother Deceased 52      congestive health failure  . Father Deceased 76    Congestive Heart Failure    Her family history includes Arthritis in her father and mother; Heart disease in her father and mother; Heart murmur in her father and mother; Hypertension in her father and mother.    Allergies  Allergen Reactions  . Penicillins Rash    Previous Medications   ALPRAZOLAM (XANAX) 0.25 MG TABLET    Take 0.25 mg by mouth at bedtime as needed.   LOSARTAN-HYDROCHLOROTHIAZIDE (HYZAAR) 50-12.5 MG PER TABLET    Take 2 tablets by mouth daily.   POTASSIUM CHLORIDE (K-DUR) 10 MEQ TABLET    Take 1 tablet (10 mEq total) by mouth daily.   TRIAMTERENE-HYDROCHLOROTHIAZIDE (DYAZIDE) 37.5-25 MG PER CAPSULE    TAKE 1 EACH (1 CAPSULE TOTAL) BY MOUTH EVERY MORNING.    Patient Care Team: Mar Daring, PA-C as PCP - General (Physician Assistant)     Objective:   Vitals: There were no vitals taken for this visit.   Physical Exam  Constitutional: She is oriented to person, place, and time. She appears well-developed and well-nourished. No distress.  HENT:  Head: Normocephalic and atraumatic.  Right Ear: Hearing, tympanic membrane, external ear and ear canal normal.  Left Ear: Hearing, tympanic membrane, external ear and ear canal normal.  Nose: Nose normal.  Mouth/Throat: Uvula is midline, oropharynx is clear and moist and mucous membranes are  normal. No oropharyngeal exudate.  Eyes: Conjunctivae and EOM are normal. Pupils are equal, round, and reactive to light. Right eye exhibits no discharge. Left eye exhibits no discharge. No scleral icterus.  Neck: Normal range of motion. Neck supple. No JVD present. Carotid bruit is not present. No tracheal deviation present. No thyromegaly present.  Cardiovascular: Normal rate, regular rhythm, normal heart sounds and intact distal pulses.  Exam reveals no gallop and no friction rub.   No murmur heard. Pulmonary/Chest: Effort normal and breath sounds normal. No respiratory  distress. She has no wheezes. She has no rales. She exhibits no tenderness. Right breast exhibits no inverted nipple, no mass, no nipple discharge, no skin change and no tenderness. Left breast exhibits no inverted nipple, no mass, no nipple discharge, no skin change and no tenderness. Breasts are symmetrical.  Large, fatty breast tissue.  No masses felt.  There are patches of red skin tissue along the breasts.  She states this has been there for as long as she can remember.  Abdominal: Soft. Bowel sounds are normal. She exhibits no distension and no mass. There is no tenderness. There is no rebound and no guarding. Hernia confirmed negative in the right inguinal area and confirmed negative in the left inguinal area.  Genitourinary: Rectum normal and vagina normal. Rectal exam shows no external hemorrhoid, no internal hemorrhoid, no mass, no tenderness and anal tone normal. Guaiac negative stool. No breast swelling, tenderness, discharge or bleeding. Pelvic exam was performed with patient supine. There is no rash, tenderness, lesion or injury on the right labia. There is no rash, tenderness, lesion or injury on the left labia. Cervix exhibits discharge (thin white discharge noted). Cervix exhibits no motion tenderness and no friability. Right adnexum displays no mass, no tenderness and no fullness. Left adnexum displays no mass, no tenderness and no fullness. No erythema, tenderness or bleeding in the vagina. No signs of injury around the vagina. No vaginal discharge found.  Uterus surgically removed.  Cervix was very difficult to visualize completely.  Musculoskeletal: Normal range of motion. She exhibits no edema or tenderness.  Lymphadenopathy:    She has no cervical adenopathy.       Right: No inguinal adenopathy present.       Left: No inguinal adenopathy present.  Neurological: She is alert and oriented to person, place, and time. She has normal reflexes. No cranial nerve deficit. Coordination  normal.  Skin: Skin is warm and dry. No rash noted. She is not diaphoretic.  Psychiatric: She has a normal mood and affect. Her behavior is normal. Judgment and thought content normal.  Vitals reviewed.    Depression Screen PHQ 2/9 Scores 11/24/2014  PHQ - 2 Score 0      Assessment & Plan:     Routine Health Maintenance and Physical Exam  Exercise Activities and Dietary recommendations Goals    None       There is no immunization history on file for this patient.  Health Maintenance  Topic Date Due  . Hepatitis C Screening  [birth year]  . HIV Screening  09/15/1967  . TETANUS/TDAP  09/15/1971  . ZOSTAVAX  09/14/2012  . MAMMOGRAM  11/20/2013  . PAP SMEAR  11/16/2014  . INFLUENZA VACCINE  11/15/2014  . COLONOSCOPY  01/28/2023      Discussed health benefits of physical activity, and encouraged her to engage in regular exercise appropriate for her age and condition.   1. Annual physical exam Will check labs and f/u pending labs  or in 6 months. - CBC with Differential - Comprehensive metabolic panel - TSH  2. Cervical cancer screening - Pap IG w/ reflex to HPV when ASC-U ([facility])  3. Breast cancer screening - Mammogram Digital Screening; Future  4. H/O hypercholesterolemia Will check labs and f/u pending labs. - Lipid panel  5. Family history of diabetes mellitus (DM) Will check labs and f/u pending labs. - Hemoglobin A1c

## 2014-11-26 LAB — PAP IG W/ RFLX HPV ASCU: PAP SMEAR COMMENT: 0

## 2014-11-29 ENCOUNTER — Encounter: Payer: Self-pay | Admitting: Physician Assistant

## 2014-12-01 ENCOUNTER — Telehealth: Payer: Self-pay

## 2014-12-01 DIAGNOSIS — I10 Essential (primary) hypertension: Secondary | ICD-10-CM

## 2014-12-01 LAB — COMPREHENSIVE METABOLIC PANEL
ALT: 29 IU/L (ref 0–32)
AST: 27 IU/L (ref 0–40)
Albumin/Globulin Ratio: 1.5 (ref 1.1–2.5)
Albumin: 4.1 g/dL (ref 3.6–4.8)
Alkaline Phosphatase: 102 IU/L (ref 39–117)
BILIRUBIN TOTAL: 0.3 mg/dL (ref 0.0–1.2)
BUN/Creatinine Ratio: 20 (ref 11–26)
BUN: 16 mg/dL (ref 8–27)
CALCIUM: 9.4 mg/dL (ref 8.7–10.3)
CHLORIDE: 101 mmol/L (ref 97–108)
CO2: 23 mmol/L (ref 18–29)
Creatinine, Ser: 0.79 mg/dL (ref 0.57–1.00)
GFR calc non Af Amer: 80 mL/min/{1.73_m2} (ref >59)
GFR, EST AFRICAN AMERICAN: 93 mL/min/{1.73_m2} (ref >59)
GLUCOSE: 91 mg/dL (ref 65–99)
Globulin, Total: 2.8 g/dL (ref 1.5–4.5)
Potassium: 5 mmol/L (ref 3.5–5.2)
Sodium: 141 mmol/L (ref 134–144)
TOTAL PROTEIN: 6.9 g/dL (ref 6.0–8.5)

## 2014-12-01 LAB — CBC WITH DIFFERENTIAL/PLATELET
BASOS ABS: 0 10*3/uL (ref 0.0–0.2)
Basos: 1 %
EOS (ABSOLUTE): 0.1 10*3/uL (ref 0.0–0.4)
Eos: 2 %
Hematocrit: 42 % (ref 34.0–46.6)
Hemoglobin: 13.9 g/dL (ref 11.1–15.9)
IMMATURE GRANS (ABS): 0 10*3/uL (ref 0.0–0.1)
IMMATURE GRANULOCYTES: 0 %
LYMPHS: 30 %
Lymphocytes Absolute: 1.7 10*3/uL (ref 0.7–3.1)
MCH: 32.5 pg (ref 26.6–33.0)
MCHC: 33.1 g/dL (ref 31.5–35.7)
MCV: 98 fL — ABNORMAL HIGH (ref 79–97)
Monocytes Absolute: 0.5 10*3/uL (ref 0.1–0.9)
Monocytes: 9 %
NEUTROS PCT: 58 %
Neutrophils Absolute: 3.2 10*3/uL (ref 1.4–7.0)
PLATELETS: 282 10*3/uL (ref 150–379)
RBC: 4.28 x10E6/uL (ref 3.77–5.28)
RDW: 13.4 % (ref 12.3–15.4)
WBC: 5.6 10*3/uL (ref 3.4–10.8)

## 2014-12-01 LAB — LIPID PANEL
CHOL/HDL RATIO: 3.3 ratio (ref 0.0–4.4)
Cholesterol, Total: 200 mg/dL — ABNORMAL HIGH (ref 100–199)
HDL: 60 mg/dL (ref >39)
LDL Calculated: 104 mg/dL — ABNORMAL HIGH (ref 0–99)
Triglycerides: 180 mg/dL — ABNORMAL HIGH (ref 0–149)
VLDL Cholesterol Cal: 36 mg/dL (ref 5–40)

## 2014-12-01 LAB — TSH: TSH: 2.86 u[IU]/mL (ref 0.450–4.500)

## 2014-12-01 LAB — HEMOGLOBIN A1C
Est. average glucose Bld gHb Est-mCnc: 114 mg/dL
Hgb A1c MFr Bld: 5.6 % (ref 4.8–5.6)

## 2014-12-01 MED ORDER — LOSARTAN POTASSIUM 100 MG PO TABS: 100.0000 mg | ORAL_TABLET | Freq: Every day | ORAL | Status: DC

## 2014-12-01 MED ORDER — TRIAMTERENE-HCTZ 37.5-25 MG PO CAPS: ORAL_CAPSULE | ORAL | Status: DC

## 2014-12-01 NOTE — Telephone Encounter (Signed)
Patient advised as directed below. Patient verbalized understanding.   Patient wanted to know if she should continue taking both blood pressure medications. Patient also needs refills sent to [facility]. Please advise.

## 2014-12-01 NOTE — Telephone Encounter (Signed)
Patient advised as directed below. 

## 2014-12-01 NOTE — Telephone Encounter (Signed)
-----   Message from Mar Daring, [place] sent at 12/01/2014 10:01 AM EDT ----- All labs stable with exception of cholesterol which is slightly elevated.  Continue to work on diet and exercise with trying to follow a low cholesterol, low fat diet and add exercise 3-4 days per week for a minimum of 30 minutes.

## 2014-12-01 NOTE — Telephone Encounter (Signed)
-----   Message from Mar Daring, [place] sent at 11/26/2014  4:59 PM EDT ----- Normal pap.

## 2014-12-01 NOTE — Telephone Encounter (Signed)
Discussed medications with patient.  Meds refilled and sent to [facility]. Will recheck in 6 months.

## 2014-12-16 ENCOUNTER — Encounter: Payer: Self-pay | Admitting: Physician Assistant

## 2014-12-16 NOTE — Telephone Encounter (Signed)
Yes that was what I was informed they were doing.  Go ahead and call [facility] today to see if they can go ahead and schedule it.  I will talk with Judson Roch the referral coordinator at [facility] to make sure that has not changed.  If so I do apologize because I told you wrong.

## 2015-01-31 ENCOUNTER — Encounter: Payer: Self-pay | Admitting: Physician Assistant

## 2015-01-31 DIAGNOSIS — B379 Candidiasis, unspecified: Secondary | ICD-10-CM

## 2015-01-31 MED ORDER — NYSTATIN 100000 UNIT/GM EX CREA: 1.0000 "application " | TOPICAL_CREAM | Freq: Two times a day (BID) | CUTANEOUS | Status: DC

## 2015-02-08 ENCOUNTER — Ambulatory Visit (INDEPENDENT_AMBULATORY_CARE_PROVIDER_SITE_OTHER): Payer: Managed Care, Other (non HMO) | Admitting: Family Medicine

## 2015-02-08 ENCOUNTER — Encounter: Payer: Self-pay | Admitting: Family Medicine

## 2015-02-08 ENCOUNTER — Ambulatory Visit: Payer: No Typology Code available for payment source

## 2015-02-08 VITALS — BP 110/80 | Temp 99.0°F | Resp 16 | Ht 65.0 in | Wt 195.0 lb

## 2015-02-08 DIAGNOSIS — R509 Fever, unspecified: Secondary | ICD-10-CM

## 2015-02-08 DIAGNOSIS — J020 Streptococcal pharyngitis: Secondary | ICD-10-CM

## 2015-02-08 LAB — POCT INFLUENZA A/B
INFLUENZA A, POC: NEGATIVE
Influenza B, POC: NEGATIVE

## 2015-02-08 LAB — POCT RAPID STREP A (OFFICE): Rapid Strep A Screen: POSITIVE — AB

## 2015-02-08 MED ORDER — AZITHROMYCIN 250 MG PO TABS: ORAL_TABLET | ORAL | Status: AC

## 2015-02-08 NOTE — Progress Notes (Signed)
Patient: Christina Conway Female    DOB: October 28, 1950   64 y.o.   MRN: [id number] Visit Date: 02/08/2015  Today's Provider: Lelon Huh, MD   Chief Complaint  Patient presents with  . Sore Throat  . Fever  . Chills   Subjective:    Sore Throat  This is a new problem. The current episode started yesterday. The problem has been gradually worsening. Neither side of throat is experiencing more pain than the other. The maximum temperature recorded prior to her arrival was 100.4 - 100.9 F. The fever has been present for 1 to 2 days. The pain is at a severity of 6/10. The pain is mild. Associated symptoms include coughing, ear pain, headaches, a hoarse voice and neck pain. Pertinent negatives include no abdominal pain, congestion, diarrhea, drooling, ear discharge, plugged ear sensation, shortness of breath, swollen glands or trouble swallowing. She has had exposure to strep. She has tried cool liquids and acetaminophen for the symptoms. The treatment provided mild relief.      Allergies  Allergen Reactions  . Penicillins Rash   Previous Medications   ALPRAZOLAM (XANAX) 0.25 MG TABLET    Take 0.25 mg by mouth at bedtime as needed.   LOSARTAN (COZAAR) 100 MG TABLET    Take 1 tablet (100 mg total) by mouth daily.   NYSTATIN CREAM (MYCOSTATIN)    Apply 1 application topically 2 (two) times daily.   TRIAMTERENE-HYDROCHLOROTHIAZIDE (DYAZIDE) 37.5-25 MG PER CAPSULE    TAKE 1 EACH (1 CAPSULE TOTAL) BY MOUTH EVERY MORNING.    Review of Systems  HENT: Positive for ear pain and hoarse voice. Negative for congestion, drooling, ear discharge and trouble swallowing.   Respiratory: Positive for cough. Negative for shortness of breath.   Cardiovascular: Negative for chest pain and palpitations.  Gastrointestinal: Negative for abdominal pain and diarrhea.  Musculoskeletal: Positive for neck pain.  Neurological: Positive for headaches. Negative for dizziness and light-headedness.    Social History   Substance Use Topics  . Smoking status: Never Smoker   . Smokeless tobacco: Not on file  . Alcohol Use: No   Objective:   BP 110/80 mmHg  Temp(Src) 99 F (37.2 C) (Oral)  Resp 16  Ht 5\' 5"  (1.651 m)  Wt 195 lb (88.451 kg)  BMI 32.45 kg/m2  Physical Exam  General Appearance:    Alert, cooperative, no distress  HENT:   bilateral TM normal without fluid or infection, neck has bilateral anterior cervical nodes enlarged, tonsils red, enlarged, with exudate present and sinuses nontender  Eyes:    PERRL, conjunctiva/corneas clear, EOM's intact       Lungs:     Clear to auscultation bilaterally, respirations unlabored  Heart:    Regular rate and rhythm  Neurologic:   Awake, alert, oriented x 3. No apparent focal neurological           defect.       Results for orders placed or performed in visit on 02/08/15  POCT rapid strep A  Result Value Ref Range   Rapid Strep A Screen Positive (A) Negative   Flu A/B negative     Assessment & Plan:     1. Streptococcus pharyngitis Allergic to pcn in childhood.  - POCT Influenza A/B - POCT rapid strep A - azithromycin (ZITHROMAX) 250 MG tablet; 2 by mouth today, then 1 daily for 4 days  Dispense: 6 tablet; Refill: 0       Lelon Huh, MD  [facility]

## 2015-03-08 DIAGNOSIS — L988 Other specified disorders of the skin and subcutaneous tissue: Secondary | ICD-10-CM | POA: Insufficient documentation

## 2015-04-16 ENCOUNTER — Encounter: Payer: Self-pay | Admitting: Emergency Medicine

## 2015-04-16 ENCOUNTER — Emergency Department: Payer: Managed Care, Other (non HMO)

## 2015-04-16 ENCOUNTER — Emergency Department
Admission: EM | Admit: 2015-04-16 | Discharge: 2015-04-16 | Disposition: A | Discharge status: Home / Self Care | Payer: Managed Care, Other (non HMO) | Attending: Emergency Medicine | Admitting: Emergency Medicine

## 2015-04-16 DIAGNOSIS — Y92481 Parking lot as the place of occurrence of the external cause: Secondary | ICD-10-CM | POA: Diagnosis not present

## 2015-04-16 DIAGNOSIS — S52591A Other fractures of lower end of right radius, initial encounter for closed fracture: Secondary | ICD-10-CM | POA: Diagnosis not present

## 2015-04-16 DIAGNOSIS — Z79899 Other long term (current) drug therapy: Secondary | ICD-10-CM | POA: Diagnosis not present

## 2015-04-16 DIAGNOSIS — W1839XA Other fall on same level, initial encounter: Secondary | ICD-10-CM | POA: Diagnosis not present

## 2015-04-16 DIAGNOSIS — S52501A Unspecified fracture of the lower end of right radius, initial encounter for closed fracture: Secondary | ICD-10-CM

## 2015-04-16 DIAGNOSIS — Z880 Allergy status to penicillin: Secondary | ICD-10-CM | POA: Insufficient documentation

## 2015-04-16 DIAGNOSIS — Y998 Other external cause status: Secondary | ICD-10-CM | POA: Diagnosis not present

## 2015-04-16 DIAGNOSIS — Y9389 Activity, other specified: Secondary | ICD-10-CM | POA: Insufficient documentation

## 2015-04-16 DIAGNOSIS — S6991XA Unspecified injury of right wrist, hand and finger(s), initial encounter: Secondary | ICD-10-CM | POA: Diagnosis present

## 2015-04-16 MED ORDER — IBUPROFEN 800 MG PO TABS
800.0000 mg | ORAL_TABLET | Freq: Once | ORAL | Status: AC
  Administered 2015-04-16: 800 mg via ORAL
  Filled 2015-04-16: qty 1

## 2015-04-16 MED ORDER — MELOXICAM 15 MG PO TABS: 15.0000 mg | ORAL_TABLET | Freq: Every day | ORAL | Status: DC

## 2015-04-16 MED ORDER — OXYCODONE HCL 5 MG PO TABS: 5.0000 mg | ORAL_TABLET | Freq: Three times a day (TID) | ORAL | Status: DC | PRN

## 2015-04-16 NOTE — ED Provider Notes (Signed)
[facility] Emergency Department Provider Note ____________________________________________  Time seen: Approximately 3:58 PM  I have reviewed the triage vital signs and the nursing notes.   HISTORY  Chief Complaint Wrist Pain   HPI Christina Conway is a 64 y.o. female who presents to the emergency department for evaluation of traumatic wrist pain after falling in a parking lot. She landed on her outstretched hand. She now has pain and swelling.   Past Medical History  Diagnosis Date  . Arthritis   . Chicken pox   . Headache(784.0)   . Hypertension   . Hyperlipidemia   . GERD (gastroesophageal reflux disease)     Patient Active Problem List   Diagnosis Date Noted  . Obesity (BMI 30-39.9) 11/18/2011  . Routine general medical examination at a health care facility 11/16/2011  . Hypertension 11/16/2011  . Contact dermatitis 11/16/2011  . Benign colonic polyp 11/16/2011    Past Surgical History  Procedure Laterality Date  . Bunionectomy  06/03/1998 & 06/04/2003  . Hand surgery  09/01/2002    righ hand  . Bunion    . Abdominal hysterectomy  08/02/1997    Current Outpatient Rx  Name  Route  Sig  Dispense  Refill  . ALPRAZolam (XANAX) 0.25 MG tablet   Oral   Take 0.25 mg by mouth at bedtime as needed.         Marland Kitchen losartan (COZAAR) 100 MG tablet   Oral   Take 1 tablet (100 mg total) by mouth daily.   30 tablet   5   . meloxicam (MOBIC) 15 MG tablet   Oral   Take 1 tablet (15 mg total) by mouth daily.   30 tablet   2   . nystatin cream (MYCOSTATIN)   Topical   Apply 1 application topically 2 (two) times daily.   30 g   0   . oxyCODONE (ROXICODONE) 5 MG immediate release tablet   Oral   Take 1 tablet (5 mg total) by mouth every 8 (eight) hours as needed.   20 tablet   0   . triamterene-hydrochlorothiazide (DYAZIDE) 37.5-25 MG per capsule      TAKE 1 EACH (1 CAPSULE TOTAL) BY MOUTH EVERY MORNING.   30 capsule   5      Allergies Penicillins  Family History  Problem Relation Age of Onset  . Arthritis Mother   . Heart disease Mother   . Hypertension Mother   . Heart murmur Mother   . Arthritis Father   . Heart disease Father   . Hypertension Father   . Heart murmur Father     Social History Social History  Substance Use Topics  . Smoking status: Never Smoker   . Smokeless tobacco: None  . Alcohol Use: No    Review of Systems Constitutional: No recent illness. Eyes: No visual changes. ENT: No sore throat. Cardiovascular: Denies chest pain or palpitations. Respiratory: Denies shortness of breath. Gastrointestinal: No abdominal pain.  Genitourinary: Negative for dysuria. Musculoskeletal: Pain in right wrist Skin: Negative for rash. Neurological: Negative for headaches, focal weakness or numbness. 10-point ROS otherwise negative.  ____________________________________________   PHYSICAL EXAM:  VITAL SIGNS: ED Triage Vitals  Enc Vitals Group     BP 04/16/15 1426 134/73 mmHg     Pulse Rate 04/16/15 1426 70     Resp 04/16/15 1426 18     Temp 04/16/15 1426 98.1 F (36.7 C)     Temp Source 04/16/15 1426 Oral  SpO2 04/16/15 1426 98 %     Weight 04/16/15 1426 180 lb (81.647 kg)     Height 04/16/15 1426 5\' 4"  (1.626 m)     Head Cir --      Peak Flow --      Pain Score --      Pain Loc --      Pain Edu? --      Excl. in [place]? --     Constitutional: Alert and oriented. Well appearing and in no acute distress. Eyes: Conjunctivae are normal. EOMI. Head: Atraumatic. Nose: No congestion/rhinnorhea. Neck: No stridor.  Respiratory: Normal respiratory effort.   Musculoskeletal: Decreased range of motion of the right wrist due to pain. There is obvious deformity in the radial aspect. Neurologic:  Normal speech and language. No gross focal neurologic deficits are appreciated. Speech is normal. No gait instability. No loss of sensation and there is full range of motion of the  fingers. Skin:  Skin is warm, dry and intact. Atraumatic. Psychiatric: Mood and affect are normal. Speech and behavior are normal.  ____________________________________________   LABS (all labs ordered are listed, but only abnormal results are displayed)  Labs Reviewed - No data to display ____________________________________________  RADIOLOGY  Intra-articular distal radius fracture on the right.  I, Sherrie George, personally viewed and evaluated these images (plain radiographs) as part of my medical decision making, as well as reviewing the written report by the radiologist.  ____________________________________________   PROCEDURES  Procedure(s) performed:   Volar OCL was applied to the right hand and wrist by ER tech. Patient was neurovascularly intact post-application.   ____________________________________________   INITIAL IMPRESSION / ASSESSMENT AND PLAN / ED COURSE  Pertinent labs & imaging results that were available during my care of the patient were reviewed by me and considered in my medical decision making (see chart for details).  Patient is established with Dr. Roland Rack. She will call him Monday morning to schedule follow-up appointment. She was advised to return to the emergency department for any symptom that changes or worsens if she is unable to see primary care or orthopedics. ____________________________________________   FINAL CLINICAL IMPRESSION(S) / ED DIAGNOSES  Final diagnoses:  Distal radius fracture, right, closed, initial encounter       Victorino Dike, FNP 04/16/15 [place], MD 04/16/15 [id number]

## 2015-04-16 NOTE — Discharge Instructions (Signed)
Forearm Fracture A forearm fracture is a break in one or both of the bones of your arm that are between the elbow and the wrist. Your forearm is made up of two bones:  Radius. This is the bone on the inside of your arm near your thumb.  Ulna. This is the bone on the outside of your arm near your little finger. Middle forearm fractures usually break both the radius and the ulna. Most forearm fractures that involve both the ulna and radius will require surgery. CAUSES Common causes of this type of fracture include:  Falling on an outstretched arm.  Accidents, such as a car or bike accident.  A hard, direct hit to the middle part of your arm. RISK FACTORS You may be at higher risk for this type of fracture if:  You play contact sports.  You have a condition that causes your bones to be weak or thin (osteoporosis). SIGNS AND SYMPTOMS A forearm fracture causes pain immediately after the injury. Other signs and symptoms include:  An abnormal bend or bump in your arm (deformity).  Swelling.  Numbness or tingling.  Tenderness.  Inability to turn your hand from side to side (rotate).  Bruising. DIAGNOSIS Your health care provider may diagnose a forearm fracture based on:  Your symptoms.  Your medical history, including any recent injury.  A physical exam. Your health care provider will look for any deformity and feel for tenderness over the break. Your health care provider will also check whether the bones are out of place.  An X-ray exam to confirm the diagnosis and learn more about the type of fracture. TREATMENT The goals of treatment are to get the bone or bones in proper position for healing and to keep the bones from moving so they will heal over time. Your treatment will depend on many factors, especially the type of fracture that you have.  If the fractured bone or bones:  Are in the correct position (nondisplaced), you may only need to wear a cast or a  splint.  Have a slightly displaced fracture, you may need to have the bones moved back into place manually (closed reduction) before the splint or cast is put on.  You may have a temporary splint before you have a cast. The splint allows room for some swelling. After a few days, a cast can replace the splint.  You may have to wear the cast for 6-8 weeks or as directed by your health care provider.  The cast may be changed after about 3 weeks or as directed by your health care provider.  After your cast is removed, you may need physical therapy to regain full movement in your wrist or elbow.  You may need emergency surgery if you have:  A fractured bone or bones that are out of position (displaced).  A fracture with multiple fragments (comminuted fracture).  A fracture that breaks the skin (open fracture). This type of fracture may require surgical wires, plates, or screws to hold the bone or bones in place.  You may have X-rays every couple of weeks to check on your healing. HOME CARE INSTRUCTIONS If You Have a Cast:  Do not stick anything inside the cast to scratch your skin. Doing that increases your risk of infection.  Check the skin around the cast every day. Report any concerns to your health care provider. You may put lotion on dry skin around the edges of the cast. Do not apply lotion to the skin  underneath the cast. °If You Have a Splint: °· Wear it as directed by your health care provider. Remove it only as directed by your health care provider. °· Loosen the splint if your fingers become numb and tingle, or if they turn cold and blue. °Bathing °· Cover the cast or splint with a watertight plastic bag to protect it from water while you bathe or shower. Do not let the cast or splint get wet. °Managing Pain, Stiffness, and Swelling °· If directed, apply ice to the injured area: °¨ Put ice in a plastic bag. °¨ Place a towel between your skin and the bag. °¨ Leave the ice on for 20  minutes, 2-3 times a day. °· Move your fingers often to avoid stiffness and to lessen swelling. °· Raise the injured area above the level of your heart while you are sitting or lying down. °Driving °· Do not drive or operate heavy machinery while taking pain medicine. °· Do not drive while wearing a cast or splint on a hand that you use for driving. °Activity °· Return to your normal activities as directed by your health care provider. Ask your health care provider what activities are safe for you. °· Perform range-of-motion exercises only as directed by your health care provider. °Safety °· Do not use your injured limb to support your body weight until your health care provider says that you can. °General Instructions °· Do not put pressure on any part of the cast or splint until it is fully hardened. This may take several hours. °· Keep the cast or splint clean and dry. °· Do not use any tobacco products, including cigarettes, chewing tobacco, or electronic cigarettes. Tobacco can delay bone healing. If you need help quitting, ask your health care provider. °· Take medicines only as directed by your health care provider. °· Keep all follow-up visits as directed by your health care provider. This is important. °SEEK MEDICAL CARE IF: °· Your pain medicine is not helping. °· Your cast or splint becomes wet or damaged or suddenly feels too tight. °· Your cast becomes loose. °· You have more severe pain or swelling than you did before the cast. °· You have severe pain when you stretch your fingers. °· You continue to have pain or stiffness in your elbow or your wrist after your cast is removed. °SEEK IMMEDIATE MEDICAL CARE IF: °· You cannot move your fingers. °· You lose feeling in your fingers or your hand. °· Your hand or your fingers turn cold and pale or blue. °· You notice a bad smell coming from your cast. °· You have drainage from underneath your cast. °· You have new stains from blood or drainage that is coming  through your cast. °  °This information is not intended to replace advice given to you by your health care provider. Make sure you discuss any questions you have with your health care provider. °  °Document Released: 03/30/2000 Document Revised: 04/23/2014 Document Reviewed: 11/16/2013 °Elsevier Interactive Patient Education ©2016 [employer] ° °

## 2015-04-16 NOTE — ED Notes (Signed)
Fell in parking lot, now having right wrist pain.

## 2015-05-23 ENCOUNTER — Encounter: Payer: Self-pay | Admitting: Physician Assistant

## 2015-05-26 ENCOUNTER — Encounter: Payer: Self-pay | Admitting: Physician Assistant

## 2015-05-26 ENCOUNTER — Ambulatory Visit: Payer: No Typology Code available for payment source | Admitting: Physician Assistant

## 2015-05-26 ENCOUNTER — Other Ambulatory Visit: Payer: Self-pay | Admitting: Physician Assistant

## 2015-05-26 DIAGNOSIS — I10 Essential (primary) hypertension: Secondary | ICD-10-CM

## 2015-05-26 MED ORDER — TRIAMTERENE-HCTZ 37.5-25 MG PO CAPS: ORAL_CAPSULE | ORAL | Status: DC

## 2015-05-26 MED ORDER — LOSARTAN POTASSIUM 100 MG PO TABS: 100.0000 mg | ORAL_TABLET | Freq: Every day | ORAL | Status: DC

## 2015-06-05 ENCOUNTER — Other Ambulatory Visit: Payer: Self-pay | Admitting: Physician Assistant

## 2015-06-05 DIAGNOSIS — I10 Essential (primary) hypertension: Secondary | ICD-10-CM

## 2015-06-10 ENCOUNTER — Other Ambulatory Visit: Payer: Self-pay | Admitting: Orthopedic Surgery

## 2015-06-10 DIAGNOSIS — S6981XA Other specified injuries of right wrist, hand and finger(s), initial encounter: Secondary | ICD-10-CM

## 2015-06-24 ENCOUNTER — Ambulatory Visit
Admission: RE | Admit: 2015-06-24 | Discharge: 2015-06-24 | Disposition: A | Discharge status: Home / Self Care | Payer: Managed Care, Other (non HMO) | Source: Ambulatory Visit | Attending: Orthopedic Surgery | Admitting: Orthopedic Surgery

## 2015-06-24 DIAGNOSIS — S6991XA Unspecified injury of right wrist, hand and finger(s), initial encounter: Secondary | ICD-10-CM | POA: Diagnosis present

## 2015-06-24 DIAGNOSIS — S6981XA Other specified injuries of right wrist, hand and finger(s), initial encounter: Secondary | ICD-10-CM

## 2015-06-24 DIAGNOSIS — S63591A Other specified sprain of right wrist, initial encounter: Secondary | ICD-10-CM | POA: Insufficient documentation

## 2015-06-24 DIAGNOSIS — S52501A Unspecified fracture of the lower end of right radius, initial encounter for closed fracture: Secondary | ICD-10-CM | POA: Insufficient documentation

## 2015-06-30 ENCOUNTER — Ambulatory Visit: Payer: No Typology Code available for payment source

## 2015-07-05 ENCOUNTER — Telehealth: Payer: Self-pay

## 2015-07-05 ENCOUNTER — Ambulatory Visit
Admission: RE | Admit: 2015-07-05 | Discharge: 2015-07-05 | Disposition: A | Discharge status: Home / Self Care | Payer: Managed Care, Other (non HMO) | Source: Ambulatory Visit | Attending: Physician Assistant | Admitting: Physician Assistant

## 2015-07-05 DIAGNOSIS — Z1231 Encounter for screening mammogram for malignant neoplasm of breast: Secondary | ICD-10-CM | POA: Diagnosis present

## 2015-07-05 DIAGNOSIS — Z1239 Encounter for other screening for malignant neoplasm of breast: Secondary | ICD-10-CM

## 2015-07-05 NOTE — Telephone Encounter (Signed)
-----   Message from Mar Daring, [place] sent at 07/05/2015 12:48 PM EDT ----- Normal mammogram. Repeat screening in one year.

## 2015-07-05 NOTE — Telephone Encounter (Signed)
Left message to call back  

## 2015-07-06 NOTE — Telephone Encounter (Signed)
Patient advised as directed below.  Thanks,  -Joseline 

## 2015-07-22 ENCOUNTER — Encounter: Payer: Self-pay | Admitting: Physician Assistant

## 2015-07-22 DIAGNOSIS — E876 Hypokalemia: Secondary | ICD-10-CM

## 2015-07-25 MED ORDER — POTASSIUM CHLORIDE ER 20 MEQ PO TBCR: 1.0000 | EXTENDED_RELEASE_TABLET | Freq: Every day | ORAL | Status: DC

## 2015-08-29 ENCOUNTER — Other Ambulatory Visit: Payer: Self-pay | Admitting: Physician Assistant

## 2015-12-16 ENCOUNTER — Other Ambulatory Visit: Payer: Self-pay | Admitting: Physician Assistant

## 2015-12-16 DIAGNOSIS — I10 Essential (primary) hypertension: Secondary | ICD-10-CM

## 2015-12-27 ENCOUNTER — Encounter: Payer: Self-pay | Admitting: Physician Assistant

## 2015-12-27 ENCOUNTER — Ambulatory Visit (INDEPENDENT_AMBULATORY_CARE_PROVIDER_SITE_OTHER): Payer: Managed Care, Other (non HMO) | Admitting: Physician Assistant

## 2015-12-27 VITALS — BP 126/70 | HR 78 | Temp 97.8°F | Resp 16 | Ht 64.0 in | Wt 187.0 lb

## 2015-12-27 DIAGNOSIS — Z0000 Encounter for general adult medical examination without abnormal findings: Secondary | ICD-10-CM

## 2015-12-27 DIAGNOSIS — I10 Essential (primary) hypertension: Secondary | ICD-10-CM

## 2015-12-27 DIAGNOSIS — F419 Anxiety disorder, unspecified: Secondary | ICD-10-CM | POA: Diagnosis not present

## 2015-12-27 DIAGNOSIS — E669 Obesity, unspecified: Secondary | ICD-10-CM | POA: Diagnosis not present

## 2015-12-27 DIAGNOSIS — Z1211 Encounter for screening for malignant neoplasm of colon: Secondary | ICD-10-CM

## 2015-12-27 LAB — IFOBT (OCCULT BLOOD): IFOBT: NEGATIVE

## 2015-12-27 MED ORDER — ALPRAZOLAM 0.25 MG PO TABS: 0.2500 mg | ORAL_TABLET | Freq: Every evening | ORAL | 5 refills | Status: DC | PRN

## 2015-12-27 NOTE — Progress Notes (Deleted)
Patient: Christina Conway, Female    DOB: 1950/05/20, 65 y.o.   MRN: [id number] Visit Date: 12/27/2015  Today's Provider: Mar Daring, PA-C   No chief complaint on file.  Subjective:    Annual wellness visit Christina Conway is a 65 y.o. female. She feels {DESC; WELL/FAIRLY WELL/POORLY:18703}. She reports exercising ***. She reports she is sleeping {DESC; WELL/FAIRLY WELL/POORLY:18703}.     Review of Systems  Constitutional: Negative.   HENT: Negative.   Eyes: Negative.   Respiratory: Negative.   Cardiovascular: Negative.   Gastrointestinal: Negative.   Endocrine: Negative.   Genitourinary: Negative.   Musculoskeletal: Negative.   Skin: Negative.   Allergic/Immunologic: Negative.   Neurological: Negative.   Hematological: Negative.   Psychiatric/Behavioral: Negative.     Social History   Social History  . Marital status: Married    Spouse name: N/A  . Number of children: 1  . Years of education: 77   Occupational History  . Not on file.   Social History Main Topics  . Smoking status: Never Smoker  . Smokeless tobacco: Not on file  . Alcohol use No  . Drug use: No  . Sexual activity: Not on file   Other Topics Concern  . Not on file   Social History Narrative  . No narrative on file    Past Medical History:  Diagnosis Date  . Arthritis   . Chicken pox   . GERD (gastroesophageal reflux disease)   . Headache(784.0)   . Hyperlipidemia   . Hypertension      Patient Active Problem List   Diagnosis Date Noted  . Facial rhytids 03/08/2015  . Obesity (BMI 30-39.9) 11/18/2011  . Routine general medical examination at a health care facility 11/16/2011  . Hypertension 11/16/2011  . Contact dermatitis 11/16/2011  . Benign colonic polyp 11/16/2011    Past Surgical History:  Procedure Laterality Date  . ABDOMINAL HYSTERECTOMY  08/02/1997  . bunion    . BUNIONECTOMY  06/03/1998 & 06/04/2003  . HAND SURGERY  09/01/2002   righ hand    Her  family history includes Arthritis in her father and mother; Heart disease in her father and mother; Heart murmur in her father and mother; Hypertension in her father and mother.    No outpatient prescriptions have been marked as taking for the 12/27/15 encounter (Appointment) with Mar Daring, PA-C.    Patient Care Team: Mar Daring, PA-C as PCP - General (Physician Assistant)    Objective:   Vitals: There were no vitals taken for this visit.  Physical Exam  Activities of Daily Living No flowsheet data found.  Fall Risk Assessment No flowsheet data found.   Depression Screen PHQ 2/9 Scores 11/24/2014  PHQ - 2 Score 0    Cognitive Testing - 6-CIT  Correct? Score   What year is it? {yes no:22349} {0-4:31231} 0 or 4  What month is it? {yes no:22349} {0-3:21082} 0 or 3  Memorize:    Christina Conway,  44,  Christina Conway,      What time is it? (within 1 hour) {yes no:22349} {0-3:21082} 0 or 3  Count backwards from 20 {yes no:22349} {0-4:31231} 0, 2, or 4  Name the months of the year {yes no:22349} {0-4:31231} 0, 2, or 4  Repeat name & address above {yes no:22349} {0-10:5044} 0, 2, 4, 6, 8, or 10       TOTAL SCORE  ***/28   Interpretation:  {  normal/abnormal:11317::"Normal"}  Normal (0-7) Abnormal (8-28)       Assessment & Plan:     Annual Wellness Visit  Reviewed patient's Family Medical History Reviewed and updated list of patient's medical providers Assessment of cognitive impairment was done Assessed patient's functional ability Established a written schedule for health screening [place] Completed and Reviewed  Exercise Activities and Dietary recommendations Goals    None       There is no immunization history on file for this patient.  Health Maintenance  Topic Date Due  . Hepatitis C Screening  February 19, 1951  . HIV Screening  09/15/1967  . TETANUS/TDAP  09/15/1971  . ZOSTAVAX  09/14/2012  . INFLUENZA VACCINE   11/15/2015  . MAMMOGRAM  07/04/2017  . PAP SMEAR  11/23/2017  . COLONOSCOPY  01/28/2023      Discussed health benefits of physical activity, and encouraged her to engage in regular exercise appropriate for her age and condition.    ------------------------------------------------------------------------------------------------------------    Mar Daring, PA-C  [place]

## 2015-12-27 NOTE — Progress Notes (Signed)
Patient: Christina Conway, Female    DOB: 19-Sep-1950, 65 y.o.   MRN: [id number] Visit Date: 12/27/2015  Today's Provider: Mar Daring, PA-C   Chief Complaint  Patient presents with  . Annual Exam   Subjective:    Annual physical exam Christina Conway is a 65 y.o. female who presents today for health maintenance and complete physical. She feels well. She reports exercising occasionally. She reports she is sleeping well. Patient's last pap smear was 11/24/14 and was normal. Her last mammogram was 07/05/15 and was normal. Her last BMD was 12/08/12 and was normal.    Review of Systems  Constitutional: Negative.   HENT: Negative.   Eyes: Negative.   Respiratory: Negative.   Cardiovascular: Negative.   Gastrointestinal: Negative.   Endocrine: Negative.   Genitourinary: Negative.   Musculoskeletal: Negative.   Skin: Negative.   Allergic/Immunologic: Negative.   Neurological: Negative.   Hematological: Negative.   Psychiatric/Behavioral: Negative.     Social History      She  reports that she has never smoked. She does not have any smokeless tobacco history on file. She reports that she does not drink alcohol or use drugs.       Social History   Social History  . Marital status: Married    Spouse name: N/A  . Number of children: 1  . Years of education: 31   Social History Main Topics  . Smoking status: Never Smoker  . Smokeless tobacco: None  . Alcohol use No  . Drug use: No  . Sexual activity: Not Asked   Other Topics Concern  . None   Social History Narrative  . None    Past Medical History:  Diagnosis Date  . Arthritis   . Chicken pox   . GERD (gastroesophageal reflux disease)   . Headache(784.0)   . Hyperlipidemia   . Hypertension      Patient Active Problem List   Diagnosis Date Noted  . Facial rhytids 03/08/2015  . Obesity (BMI 30-39.9) 11/18/2011  . Routine general medical examination at a health care facility 11/16/2011  . Hypertension  11/16/2011  . Contact dermatitis 11/16/2011  . Benign colonic polyp 11/16/2011    Past Surgical History:  Procedure Laterality Date  . ABDOMINAL HYSTERECTOMY  08/02/1997  . bunion    . BUNIONECTOMY  06/03/1998 & 06/04/2003  . HAND SURGERY  09/01/2002   righ hand    Family History        Family Status  Relation Status  . Mother Deceased at age 65   congestive health failure  . Father Deceased at age 73   Congestive Heart Failure  . Sister Alive        Her family history includes Arthritis in her father and mother; Heart disease in her father and mother; Heart murmur in her father and mother; Hypertension in her father and mother.    Allergies  Allergen Reactions  . Penicillins Rash    Current Meds  Medication Sig  . ALPRAZolam (XANAX) 0.25 MG tablet Take 0.25 mg by mouth at bedtime as needed.  Marland Kitchen KLOR-CON M20 20 MEQ tablet TAKE 1 TABLET BY MOUTH DAILY.  Marland Kitchen losartan (COZAAR) 100 MG tablet TAKE 1 TABLET (100 MG TOTAL) BY MOUTH DAILY.  . meloxicam (MOBIC) 15 MG tablet Take 1 tablet (15 mg total) by mouth daily.  Marland Kitchen oxyCODONE (ROXICODONE) 5 MG immediate release tablet Take 1 tablet (5 mg total) by mouth every 8 (  eight) hours as needed.  . triamterene-hydrochlorothiazide (DYAZIDE) 37.5-25 MG capsule TAKE 1 EACH (1 CAPSULE TOTAL) BY MOUTH EVERY MORNING.  . [DISCONTINUED] nystatin cream (MYCOSTATIN) Apply 1 application topically 2 (two) times daily.    Patient Care Team: Mar Daring, PA-C as PCP - General (Physician Assistant)     Objective:   Vitals: BP 126/70 (BP Location: Left Arm, Patient Position: Sitting, Cuff Size: Normal)   Pulse 78   Temp 97.8 F (36.6 C)   Resp 16   Ht 5\' 4"  (1.626 m)   Wt 187 lb (84.8 kg)   BMI 32.10 kg/m    Physical Exam  Constitutional: She is oriented to person, place, and time. She appears well-developed and well-nourished. No distress.  HENT:  Head: Normocephalic and atraumatic.  Right Ear: Hearing, tympanic membrane,  external ear and ear canal normal.  Left Ear: Hearing, tympanic membrane, external ear and ear canal normal.  Nose: Nose normal.  Mouth/Throat: Uvula is midline, oropharynx is clear and moist and mucous membranes are normal. No oropharyngeal exudate.  Eyes: Conjunctivae and EOM are normal. Pupils are equal, round, and reactive to light. Right eye exhibits no discharge. Left eye exhibits no discharge. No scleral icterus.  Neck: Normal range of motion. Neck supple. No JVD present. Carotid bruit is not present. No tracheal deviation present. No thyromegaly present.  Cardiovascular: Normal rate, regular rhythm, normal heart sounds and intact distal pulses.  Exam reveals no gallop and no friction rub.   No murmur heard. Pulmonary/Chest: Effort normal and breath sounds normal. No respiratory distress. She has no wheezes. She has no rales. She exhibits no tenderness. Right breast exhibits no inverted nipple, no mass, no nipple discharge, no skin change and no tenderness. Left breast exhibits no inverted nipple, no mass, no nipple discharge, no skin change and no tenderness. Breasts are symmetrical.  Abdominal: Soft. Bowel sounds are normal. She exhibits no distension and no mass. There is no tenderness. There is no rebound and no guarding. Hernia confirmed negative in the right inguinal area and confirmed negative in the left inguinal area.  Genitourinary: Vagina normal. Rectal exam shows external hemorrhoid and internal hemorrhoid. Rectal exam shows no fissure, no mass, no tenderness, anal tone normal and guaiac negative stool. No breast swelling, tenderness, discharge or bleeding. Pelvic exam was performed with patient supine. There is no rash, tenderness, lesion or injury on the right labia. There is no rash, tenderness, lesion or injury on the left labia. Right adnexum displays no mass, no tenderness and no fullness. Left adnexum displays no mass, no tenderness and no fullness. No erythema, tenderness or  bleeding in the vagina. No signs of injury around the vagina. No vaginal discharge found.  Genitourinary Comments: Uterus surgically removed  Musculoskeletal: Normal range of motion. She exhibits no edema or tenderness.  Lymphadenopathy:    She has no cervical adenopathy.       Right: No inguinal adenopathy present.       Left: No inguinal adenopathy present.  Neurological: She is alert and oriented to person, place, and time. She has normal reflexes. No cranial nerve deficit. Coordination normal.  Skin: Skin is warm and dry. No rash noted. She is not diaphoretic.  Psychiatric: She has a normal mood and affect. Her behavior is normal. Judgment and thought content normal.  Vitals reviewed.   Depression Screen PHQ 2/9 Scores 11/24/2014  PHQ - 2 Score 0    Assessment & Plan:     Routine Health Maintenance and  Physical Exam  Exercise Activities and Dietary recommendations Goals    None       There is no immunization history on file for this patient.  Health Maintenance  Topic Date Due  . Hepatitis C Screening  1950-12-24  . HIV Screening  09/15/1967  . TETANUS/TDAP  09/15/1971  . ZOSTAVAX  09/14/2012  . INFLUENZA VACCINE  11/15/2015  . MAMMOGRAM  07/04/2017  . PAP SMEAR  11/23/2017  . COLONOSCOPY  01/28/2023      Discussed health benefits of physical activity, and encouraged her to engage in regular exercise appropriate for her age and condition.   1. Annual physical exam Normal physical exam today. Will check labs as below and f/u pending lab results. If labs are stable and WNL she will not need to have these rechecked for one year at her next annual physical exam. She is to call the office in the meantime if she has any acute issue, questions or concerns. - Hepatitis C antibody  2. Essential hypertension Stable. Continue current medical treatment plan. Will check labs as below and f/u pending results. - Comprehensive metabolic panel  3. Obesity (BMI 30-39.9) Will  check labs as below and f/u pending results. - CBC with Differential/Platelet - Lipid panel - TSH - Hemoglobin A1c  4. Acute anxiety Stable. Diagnosis pulled for medication refill. Continue current medical treatment plan. Will check labs as below and f/u pending results. - TSH - ALPRAZolam (XANAX) 0.25 MG tablet; Take 1 tablet (0.25 mg total) by mouth at bedtime as needed.  Dispense: 30 tablet; Refill: 5  5. Colon cancer screening Fecal occult negative in office. - IFOBT POC (occult bld, rslt in office)   --------------------------------------------------------------------    Mar Daring, PA-C  [facility]

## 2015-12-27 NOTE — Patient Instructions (Signed)

## 2016-01-05 LAB — CBC WITH DIFFERENTIAL/PLATELET
BASOS ABS: 0 10*3/uL (ref 0.0–0.2)
Basos: 0 %
EOS (ABSOLUTE): 0.2 10*3/uL (ref 0.0–0.4)
Eos: 3 %
Hematocrit: 37.2 % (ref 34.0–46.6)
Hemoglobin: 12.8 g/dL (ref 11.1–15.9)
IMMATURE GRANS (ABS): 0 10*3/uL (ref 0.0–0.1)
Immature Granulocytes: 0 %
LYMPHS ABS: 2 10*3/uL (ref 0.7–3.1)
LYMPHS: 36 %
MCH: 33.5 pg — AB (ref 26.6–33.0)
MCHC: 34.4 g/dL (ref 31.5–35.7)
MCV: 97 fL (ref 79–97)
Monocytes Absolute: 0.4 10*3/uL (ref 0.1–0.9)
Monocytes: 8 %
NEUTROS ABS: 3.1 10*3/uL (ref 1.4–7.0)
Neutrophils: 53 %
PLATELETS: 291 10*3/uL (ref 150–379)
RBC: 3.82 x10E6/uL (ref 3.77–5.28)
RDW: 13.2 % (ref 12.3–15.4)
WBC: 5.7 10*3/uL (ref 3.4–10.8)

## 2016-01-05 LAB — COMPREHENSIVE METABOLIC PANEL WITH GFR
ALT: 22 IU/L (ref 0–32)
AST: 19 IU/L (ref 0–40)
Albumin/Globulin Ratio: 1.5 (ref 1.2–2.2)
Albumin: 4 g/dL (ref 3.6–4.8)
Alkaline Phosphatase: 98 IU/L (ref 39–117)
BUN/Creatinine Ratio: 31 — ABNORMAL HIGH (ref 12–28)
BUN: 29 mg/dL — ABNORMAL HIGH (ref 8–27)
Bilirubin Total: <0.2 mg/dL (ref 0.0–1.2)
CO2: 24 mmol/L (ref 18–29)
Calcium: 9.7 mg/dL (ref 8.7–10.3)
Chloride: 101 mmol/L (ref 96–106)
Creatinine, Ser: 0.94 mg/dL (ref 0.57–1.00)
GFR calc Af Amer: 75 mL/min/1.73
GFR calc non Af Amer: 65 mL/min/1.73
Globulin, Total: 2.7 g/dL (ref 1.5–4.5)
Glucose: 98 mg/dL (ref 65–99)
Potassium: 3.5 mmol/L (ref 3.5–5.2)
Sodium: 142 mmol/L (ref 134–144)
Total Protein: 6.7 g/dL (ref 6.0–8.5)

## 2016-01-05 LAB — LIPID PANEL
CHOLESTEROL TOTAL: 237 mg/dL — AB (ref 100–199)
Chol/HDL Ratio: 4.9 ratio units — ABNORMAL HIGH (ref 0.0–4.4)
HDL: 48 mg/dL (ref >39)
Triglycerides: 470 mg/dL — ABNORMAL HIGH (ref 0–149)

## 2016-01-05 LAB — TSH: TSH: 4.95 u[IU]/mL — ABNORMAL HIGH (ref 0.450–4.500)

## 2016-01-05 LAB — HEMOGLOBIN A1C
Est. average glucose Bld gHb Est-mCnc: 105 mg/dL
Hgb A1c MFr Bld: 5.3 % (ref 4.8–5.6)

## 2016-01-05 LAB — HEPATITIS C ANTIBODY

## 2016-01-06 ENCOUNTER — Telehealth: Payer: Self-pay

## 2016-01-06 NOTE — Telephone Encounter (Signed)
-----   Message from Mar Daring, [place] sent at 01/06/2016 11:11 AM EDT ----- Cholesterol is elevated and increased from last year. I do recommend starting a cholesterol lowering medication if patient is willing. Also thyroid is slightly elevated. I think we should recheck in 3 months to see if it returns to normal. HgBA1c has improved some from 5.6 to 5.3. Continue working on lifestyle modifications including healthy dieting (limiting fatty foods, sugars and carbs) and increasing physical activity.

## 2016-01-06 NOTE — Telephone Encounter (Signed)
Left message to call back  

## 2016-01-10 NOTE — Telephone Encounter (Signed)
ok 

## 2016-01-10 NOTE — Telephone Encounter (Signed)
Patient advised as below. Patient reports that she would like to work on lifestyle changes before starting medication for cholesterol. Patient reports that she will send Sonia Baller an email on mychart by Thursday to update her on her plans.

## 2016-01-13 ENCOUNTER — Encounter: Payer: Self-pay | Admitting: Physician Assistant

## 2016-06-12 ENCOUNTER — Other Ambulatory Visit: Payer: Self-pay | Admitting: Physician Assistant

## 2016-06-12 DIAGNOSIS — Z1231 Encounter for screening mammogram for malignant neoplasm of breast: Secondary | ICD-10-CM

## 2016-06-17 ENCOUNTER — Other Ambulatory Visit: Payer: Self-pay | Admitting: Physician Assistant

## 2016-06-17 DIAGNOSIS — I10 Essential (primary) hypertension: Secondary | ICD-10-CM

## 2016-06-18 NOTE — Telephone Encounter (Signed)
Last ov 12/27/15 Last filled 12/16/15 Please review. Thank you. sd

## 2016-06-19 ENCOUNTER — Encounter: Payer: Self-pay | Admitting: Physician Assistant

## 2016-06-19 DIAGNOSIS — I10 Essential (primary) hypertension: Secondary | ICD-10-CM

## 2016-06-20 MED ORDER — TRIAMTERENE-HCTZ 37.5-25 MG PO CAPS: ORAL_CAPSULE | ORAL | 5 refills | Status: DC

## 2016-06-20 MED ORDER — LOSARTAN POTASSIUM 100 MG PO TABS: 100.0000 mg | ORAL_TABLET | Freq: Every day | ORAL | 5 refills | Status: DC

## 2016-07-10 ENCOUNTER — Telehealth: Payer: Self-pay | Admitting: Physician Assistant

## 2016-07-10 ENCOUNTER — Ambulatory Visit
Admission: RE | Admit: 2016-07-10 | Discharge: 2016-07-10 | Disposition: A | Discharge status: Home / Self Care | Payer: Managed Care, Other (non HMO) | Source: Ambulatory Visit | Attending: Physician Assistant | Admitting: Physician Assistant

## 2016-07-10 ENCOUNTER — Other Ambulatory Visit: Payer: Self-pay | Admitting: Physician Assistant

## 2016-07-10 DIAGNOSIS — Z1231 Encounter for screening mammogram for malignant neoplasm of breast: Secondary | ICD-10-CM

## 2016-07-10 NOTE — Telephone Encounter (Signed)
Patient advised of her mammogram results.  Thanks,  -Kinisha Soper

## 2016-07-10 NOTE — Telephone Encounter (Signed)
Pt returning call from our office  teri

## 2017-01-01 ENCOUNTER — Encounter: Payer: Managed Care, Other (non HMO) | Admitting: Physician Assistant

## 2017-01-08 ENCOUNTER — Ambulatory Visit (INDEPENDENT_AMBULATORY_CARE_PROVIDER_SITE_OTHER): Payer: Managed Care, Other (non HMO) | Admitting: Physician Assistant

## 2017-01-08 ENCOUNTER — Encounter: Payer: Self-pay | Admitting: Physician Assistant

## 2017-01-08 VITALS — BP 128/80 | HR 80 | Temp 98.3°F | Resp 16 | Ht 64.0 in | Wt 188.4 lb

## 2017-01-08 DIAGNOSIS — B354 Tinea corporis: Secondary | ICD-10-CM | POA: Diagnosis not present

## 2017-01-08 DIAGNOSIS — F419 Anxiety disorder, unspecified: Secondary | ICD-10-CM | POA: Diagnosis not present

## 2017-01-08 DIAGNOSIS — Z136 Encounter for screening for cardiovascular disorders: Secondary | ICD-10-CM | POA: Diagnosis not present

## 2017-01-08 DIAGNOSIS — Z131 Encounter for screening for diabetes mellitus: Secondary | ICD-10-CM

## 2017-01-08 DIAGNOSIS — I10 Essential (primary) hypertension: Secondary | ICD-10-CM | POA: Diagnosis not present

## 2017-01-08 DIAGNOSIS — Z0000 Encounter for general adult medical examination without abnormal findings: Secondary | ICD-10-CM

## 2017-01-08 DIAGNOSIS — Z13220 Encounter for screening for lipoid disorders: Secondary | ICD-10-CM

## 2017-01-08 DIAGNOSIS — Z6832 Body mass index (BMI) 32.0-32.9, adult: Secondary | ICD-10-CM

## 2017-01-08 DIAGNOSIS — Z2821 Immunization not carried out because of patient refusal: Secondary | ICD-10-CM | POA: Diagnosis not present

## 2017-01-08 MED ORDER — TRIAMTERENE-HCTZ 37.5-25 MG PO CAPS: ORAL_CAPSULE | ORAL | 5 refills | Status: DC

## 2017-01-08 MED ORDER — NYSTATIN 100000 UNIT/GM EX CREA: 1.0000 "application " | TOPICAL_CREAM | Freq: Two times a day (BID) | CUTANEOUS | 0 refills | Status: DC

## 2017-01-08 MED ORDER — ALPRAZOLAM 0.25 MG PO TABS: 0.2500 mg | ORAL_TABLET | Freq: Every evening | ORAL | 5 refills | Status: DC | PRN

## 2017-01-08 MED ORDER — LOSARTAN POTASSIUM 100 MG PO TABS: 100.0000 mg | ORAL_TABLET | Freq: Every day | ORAL | 5 refills | Status: DC

## 2017-01-08 NOTE — Patient Instructions (Signed)
Health Maintenance for Postmenopausal Women Menopause is a normal process in which your reproductive ability comes to an end. This process happens gradually over a span of months to years, usually between the ages of 22 and 9. Menopause is complete when you have missed 12 consecutive menstrual periods. It is important to talk with your health care provider about some of the most common conditions that affect postmenopausal women, such as heart disease, cancer, and bone loss (osteoporosis). Adopting a healthy lifestyle and getting preventive care can help to promote your health and wellness. Those actions can also lower your chances of developing some of these common conditions. What should I know about menopause? During menopause, you may experience a number of symptoms, such as:  Moderate-to-severe hot flashes.  Night sweats.  Decrease in sex drive.  Mood swings.  Headaches.  Tiredness.  Irritability.  Memory problems.  Insomnia.  Choosing to treat or not to treat menopausal changes is an individual decision that you make with your health care provider. What should I know about hormone replacement therapy and supplements? Hormone therapy products are effective for treating symptoms that are associated with menopause, such as hot flashes and night sweats. Hormone replacement carries certain risks, especially as you become older. If you are thinking about using estrogen or estrogen with progestin treatments, discuss the benefits and risks with your health care provider. What should I know about heart disease and stroke? Heart disease, heart attack, and stroke become more likely as you age. This may be due, in part, to the hormonal changes that your body experiences during menopause. These can affect how your body processes dietary fats, triglycerides, and cholesterol. Heart attack and stroke are both medical emergencies. There are many things that you can do to help prevent heart disease  and stroke:  Have your blood pressure checked at least every 1-2 years. High blood pressure causes heart disease and increases the risk of stroke.  If you are 53-22 years old, ask your health care provider if you should take aspirin to prevent a heart attack or a stroke.  Do not use any tobacco products, including cigarettes, chewing tobacco, or electronic cigarettes. If you need help quitting, ask your health care provider.  It is important to eat a healthy diet and maintain a healthy weight. ? Be sure to include plenty of vegetables, fruits, low-fat dairy products, and lean protein. ? Avoid eating foods that are high in solid fats, added sugars, or salt (sodium).  Get regular exercise. This is one of the most important things that you can do for your health. ? Try to exercise for at least 150 minutes each week. The type of exercise that you do should increase your heart rate and make you sweat. This is known as moderate-intensity exercise. ? Try to do strengthening exercises at least twice each week. Do these in addition to the moderate-intensity exercise.  Know your numbers.Ask your health care provider to check your cholesterol and your blood glucose. Continue to have your blood tested as directed by your health care provider.  What should I know about cancer screening? There are several types of cancer. Take the following steps to reduce your risk and to catch any cancer development as early as possible. Breast Cancer  Practice breast self-awareness. ? This means understanding how your breasts normally appear and feel. ? It also means doing regular breast self-exams. Let your health care provider know about any changes, no matter how small.  If you are 40  or older, have a clinician do a breast exam (clinical breast exam or CBE) every year. Depending on your age, family history, and medical history, it may be recommended that you also have a yearly breast X-ray (mammogram).  If you  have a family history of breast cancer, talk with your health care provider about genetic screening.  If you are at high risk for breast cancer, talk with your health care provider about having an MRI and a mammogram every year.  Breast cancer (BRCA) gene test is recommended for women who have family members with BRCA-related cancers. Results of the assessment will determine the need for genetic counseling and BRCA1 and for BRCA2 testing. BRCA-related cancers include these types: ? Breast. This occurs in males or females. ? Ovarian. ? Tubal. This may also be called fallopian tube cancer. ? Cancer of the abdominal or pelvic lining (peritoneal cancer). ? Prostate. ? Pancreatic.  Cervical, Uterine, and Ovarian Cancer Your health care provider may recommend that you be screened regularly for cancer of the pelvic organs. These include your ovaries, uterus, and vagina. This screening involves a pelvic exam, which includes checking for microscopic changes to the surface of your cervix (Pap test).  For women ages 21-65, health care providers may recommend a pelvic exam and a Pap test every three years. For women ages 79-65, they may recommend the Pap test and pelvic exam, combined with testing for human papilloma virus (HPV), every five years. Some types of HPV increase your risk of cervical cancer. Testing for HPV may also be done on women of any age who have unclear Pap test results.  Other health care providers may not recommend any screening for nonpregnant women who are considered low risk for pelvic cancer and have no symptoms. Ask your health care provider if a screening pelvic exam is right for you.  If you have had past treatment for cervical cancer or a condition that could lead to cancer, you need Pap tests and screening for cancer for at least 20 years after your treatment. If Pap tests have been discontinued for you, your risk factors (such as having a new sexual partner) need to be  reassessed to determine if you should start having screenings again. Some women have medical problems that increase the chance of getting cervical cancer. In these cases, your health care provider may recommend that you have screening and Pap tests more often.  If you have a family history of uterine cancer or ovarian cancer, talk with your health care provider about genetic screening.  If you have vaginal bleeding after reaching menopause, tell your health care provider.  There are currently no reliable tests available to screen for ovarian cancer.  Lung Cancer Lung cancer screening is recommended for adults 69-62 years old who are at high risk for lung cancer because of a history of smoking. A yearly low-dose CT scan of the lungs is recommended if you:  Currently smoke.  Have a history of at least 30 pack-years of smoking and you currently smoke or have quit within the past 15 years. A pack-year is smoking an average of one pack of cigarettes per day for one year.  Yearly screening should:  Continue until it has been 15 years since you quit.  Stop if you develop a health problem that would prevent you from having lung cancer treatment.  Colorectal Cancer  This type of cancer can be detected and can often be prevented.  Routine colorectal cancer screening usually begins at  age 42 and continues through age 45.  If you have risk factors for colon cancer, your health care provider may recommend that you be screened at an earlier age.  If you have a family history of colorectal cancer, talk with your health care provider about genetic screening.  Your health care provider may also recommend using home test kits to check for hidden blood in your stool.  A small camera at the end of a tube can be used to examine your colon directly (sigmoidoscopy or colonoscopy). This is done to check for the earliest forms of colorectal cancer.  Direct examination of the colon should be repeated every  5-10 years until age 71. However, if early forms of precancerous polyps or small growths are found or if you have a family history or genetic risk for colorectal cancer, you may need to be screened more often.  Skin Cancer  Check your skin from head to toe regularly.  Monitor any moles. Be sure to tell your health care provider: ? About any new moles or changes in moles, especially if there is a change in a mole's shape or color. ? If you have a mole that is larger than the size of a pencil eraser.  If any of your family members has a history of skin cancer, especially at a young age, talk with your health care provider about genetic screening.  Always use sunscreen. Apply sunscreen liberally and repeatedly throughout the day.  Whenever you are outside, protect yourself by wearing long sleeves, pants, a wide-brimmed hat, and sunglasses.  What should I know about osteoporosis? Osteoporosis is a condition in which bone destruction happens more quickly than new bone creation. After menopause, you may be at an increased risk for osteoporosis. To help prevent osteoporosis or the bone fractures that can happen because of osteoporosis, the following is recommended:  If you are 46-71 years old, get at least 1,000 mg of calcium and at least 600 mg of vitamin D per day.  If you are older than age 55 but younger than age 65, get at least 1,200 mg of calcium and at least 600 mg of vitamin D per day.  If you are older than age 54, get at least 1,200 mg of calcium and at least 800 mg of vitamin D per day.  Smoking and excessive alcohol intake increase the risk of osteoporosis. Eat foods that are rich in calcium and vitamin D, and do weight-bearing exercises several times each week as directed by your health care provider. What should I know about how menopause affects my mental health? Depression may occur at any age, but it is more common as you become older. Common symptoms of depression  include:  Low or sad mood.  Changes in sleep patterns.  Changes in appetite or eating patterns.  Feeling an overall lack of motivation or enjoyment of activities that you previously enjoyed.  Frequent crying spells.  Talk with your health care provider if you think that you are experiencing depression. What should I know about immunizations? It is important that you get and maintain your immunizations. These include:  Tetanus, diphtheria, and pertussis (Tdap) booster vaccine.  Influenza every year before the flu season begins.  Pneumonia vaccine.  Shingles vaccine.  Your health care provider may also recommend other immunizations. This information is not intended to replace advice given to you by your health care provider. Make sure you discuss any questions you have with your health care provider. Document Released: 05/25/2005  Document Revised: 10/21/2015 Document Reviewed: 01/04/2015 Elsevier Interactive Patient Education  2018 [employer]  

## 2017-01-08 NOTE — Progress Notes (Signed)
Patient: Christina Conway, Female    DOB: 08/02/1950, 66 y.o.   MRN: [id number] Visit Date: 01/08/2017  Today's Provider: Mar Daring, PA-C   Chief Complaint  Patient presents with  . Annual Exam   Subjective:    Annual physical exam Christina Conway is a 66 y.o. female who presents today for health maintenance and complete physical. She feels well. She reports exercising,walks 2-3 times a week and is doing weight watchers. She reports she is sleeping well. Patient Declined Influenza vaccine.  Last CPE:12/27/15 Pap:11/24/14 Normal Mammogram:07/10/16 BI-RADS 1: Negative Colonoscopy:01/27/13 Diverticulosis,Dr.Elliott -----------------------------------------------------------------   Review of Systems  Constitutional: Negative.   HENT: Negative.   Eyes: Negative.   Respiratory: Negative.   Cardiovascular: Negative.   Gastrointestinal: Negative.   Endocrine: Negative.   Genitourinary: Negative.   Musculoskeletal: Negative.   Skin: Negative.   Allergic/Immunologic: Negative.   Neurological: Negative.   Hematological: Negative.   Psychiatric/Behavioral: Negative.     Social History      She  reports that she has never smoked. She has never used smokeless tobacco. She reports that she does not drink alcohol or use drugs.       Social History   Social History  . Marital status: Married    Spouse name: N/A  . Number of children: 1  . Years of education: 38   Social History Main Topics  . Smoking status: Never Smoker  . Smokeless tobacco: Never Used  . Alcohol use No     Comment: 1-2 a month  . Drug use: No  . Sexual activity: Not Asked   Other Topics Concern  . None   Social History Narrative  . None    Past Medical History:  Diagnosis Date  . Arthritis   . Chicken pox   . GERD (gastroesophageal reflux disease)   . Headache(784.0)   . Hyperlipidemia   . Hypertension      Patient Active Problem List   Diagnosis Date Noted  . Facial rhytids  03/08/2015  . Obesity (BMI 30-39.9) 11/18/2011  . Routine general medical examination at a health care facility 11/16/2011  . Hypertension 11/16/2011  . Contact dermatitis 11/16/2011  . Benign colonic polyp 11/16/2011    Past Surgical History:  Procedure Laterality Date  . ABDOMINAL HYSTERECTOMY  08/02/1997  . bunion    . BUNIONECTOMY  06/03/1998 & 06/04/2003  . HAND SURGERY  09/01/2002   righ hand    Family History        Family Status  Relation Status  . Mother Deceased at age 68       congestive health failure  . Father Deceased at age 88       Congestive Heart Failure  . Sister Alive        Her family history includes Arthritis in her father and mother; Heart disease in her father and mother; Heart murmur in her father and mother; Hypertension in her father and mother.     Allergies  Allergen Reactions  . Penicillins Rash     Current Outpatient Prescriptions:  .  ALPRAZolam (XANAX) 0.25 MG tablet, Take 1 tablet (0.25 mg total) by mouth at bedtime as needed., Disp: 30 tablet, Rfl: 5 .  KLOR-CON M20 20 MEQ tablet, TAKE 1 TABLET BY MOUTH DAILY., Disp: 30 tablet, Rfl: 6 .  losartan (COZAAR) 100 MG tablet, Take 1 tablet (100 mg total) by mouth daily., Disp: 30 tablet, Rfl: 5 .  meloxicam (MOBIC) 15  MG tablet, Take 1 tablet (15 mg total) by mouth daily., Disp: 30 tablet, Rfl: 2 .  triamterene-hydrochlorothiazide (DYAZIDE) 37.5-25 MG capsule, TAKE 1 EACH (1 CAPSULE TOTAL) BY MOUTH EVERY MORNING., Disp: 30 capsule, Rfl: 5   Patient Care Team: Mar Daring, PA-C as PCP - General (Physician Assistant)      Objective:   Vitals: BP 128/80 (BP Location: Left Arm, Patient Position: Sitting, Cuff Size: Normal)   Pulse 80   Temp 98.3 F (36.8 C) (Oral)   Resp 16   Ht 5\' 4"  (1.626 m)   Wt 188 lb 6.4 oz (85.5 kg)   BMI 32.34 kg/m     Physical Exam  Constitutional: She is oriented to person, place, and time. She appears well-developed and well-nourished. No  distress.  HENT:  Head: Normocephalic and atraumatic.  Right Ear: Hearing, tympanic membrane, external ear and ear canal normal.  Left Ear: Hearing, tympanic membrane, external ear and ear canal normal.  Nose: Nose normal.  Mouth/Throat: Uvula is midline, oropharynx is clear and moist and mucous membranes are normal. No oropharyngeal exudate.  Eyes: Pupils are equal, round, and reactive to light. Conjunctivae and EOM are normal. Right eye exhibits no discharge. Left eye exhibits no discharge. No scleral icterus.  Neck: Normal range of motion. Neck supple. No JVD present. Carotid bruit is not present. No tracheal deviation present. No thyromegaly present.  Cardiovascular: Normal rate, regular rhythm, normal heart sounds and intact distal pulses.  Exam reveals no gallop and no friction rub.   No murmur heard. Pulmonary/Chest: Effort normal and breath sounds normal. No respiratory distress. She has no wheezes. She has no rales. She exhibits no tenderness. Right breast exhibits no inverted nipple, no mass, no nipple discharge, no skin change and no tenderness. Left breast exhibits no inverted nipple, no mass, no nipple discharge, no skin change and no tenderness. Breasts are symmetrical.  Abdominal: Soft. Bowel sounds are normal. She exhibits no distension and no mass. There is no tenderness. There is no rebound and no guarding.  Musculoskeletal: Normal range of motion. She exhibits no edema or tenderness.  Lymphadenopathy:    She has no cervical adenopathy.  Neurological: She is alert and oriented to person, place, and time. She has normal reflexes.  Skin: Skin is warm and dry. No rash noted. She is not diaphoretic.     Psychiatric: She has a normal mood and affect. Her behavior is normal. Judgment and thought content normal.  Vitals reviewed.    Depression Screen PHQ 2/9 Scores 01/08/2017 11/24/2014  PHQ - 2 Score 0 0      Assessment & Plan:    Routine Health Maintenance and Physical  Exam  Exercise Activities and Dietary recommendations Goals    None       There is no immunization history on file for this patient.  Health Maintenance  Topic Date Due  . HIV Screening  09/15/1967  . TETANUS/TDAP  09/15/1971  . INFLUENZA VACCINE  07/14/2017 (Originally 11/14/2016)  . PAP SMEAR  11/23/2017  . MAMMOGRAM  07/11/2018  . COLONOSCOPY  01/28/2023  . Hepatitis C Screening  Completed     Discussed health benefits of physical activity, and encouraged her to engage in regular exercise appropriate for her age and condition.   1. Annual physical exam Normal physical exam today. Will check labs as below and f/u pending lab results. If labs are stable and WNL she will not need to have these rechecked for one year at her  next annual physical exam. She is to call the office in the meantime if she has any acute issue, questions or concerns. - CBC with Differential/Platelet - Comprehensive metabolic panel - TSH  2. Encounter for screening for diabetes mellitus Will check labs as below and f/u pending results. - Hemoglobin A1c  3. Encounter for lipid screening for cardiovascular disease Will check labs as below and f/u pending results. - Lipid panel  4. Essential hypertension Stable. Diagnosis pulled for medication refill. Continue current medical treatment plan. - losartan (COZAAR) 100 MG tablet; Take 1 tablet (100 mg total) by mouth daily.  Dispense: 30 tablet; Refill: 5 - triamterene-hydrochlorothiazide (DYAZIDE) 37.5-25 MG capsule; TAKE 1 EACH (1 CAPSULE TOTAL) BY MOUTH EVERY MORNING.  Dispense: 30 capsule; Refill: 5  5. Acute anxiety Stable. Diagnosis pulled for medication refill. Continue current medical treatment plan. - ALPRAZolam (XANAX) 0.25 MG tablet; Take 1 tablet (0.25 mg total) by mouth at bedtime as needed.  Dispense: 30 tablet; Refill: 5  6. Tinea corporis Will give nystatin cream as below as this as worked when she has had tinea previously. She is to call  if no improvements.  - nystatin cream (MYCOSTATIN); Apply 1 application topically 2 (two) times daily.  Dispense: 30 g; Refill: 0  7. Influenza vaccination declined  8. BMI 32.0-32.9,adult Patient is currently doing weight watchers and she has lost 11 pounds in the last 3 months.   --------------------------------------------------------------------    Mar Daring, PA-C  [place]

## 2017-01-09 LAB — CBC WITH DIFFERENTIAL/PLATELET
BASOS PCT: 0.7 %
Basophils Absolute: 42 cells/uL (ref 0–200)
EOS ABS: 138 {cells}/uL (ref 15–500)
Eosinophils Relative: 2.3 %
HCT: 39.6 % (ref 35.0–45.0)
Hemoglobin: 13.5 g/dL (ref 11.7–15.5)
Lymphs Abs: 1908 cells/uL (ref 850–3900)
MCH: 32.7 pg (ref 27.0–33.0)
MCHC: 34.1 g/dL (ref 32.0–36.0)
MCV: 95.9 fL (ref 80.0–100.0)
MPV: 10.3 fL (ref 7.5–12.5)
Monocytes Relative: 8.8 %
NEUTROS PCT: 56.4 %
Neutro Abs: 3384 cells/uL (ref 1500–7800)
PLATELETS: 274 10*3/uL (ref 140–400)
RBC: 4.13 10*6/uL (ref 3.80–5.10)
RDW: 12.5 % (ref 11.0–15.0)
TOTAL LYMPHOCYTE: 31.8 %
WBC: 6 10*3/uL (ref 3.8–10.8)
WBCMIX: 528 {cells}/uL (ref 200–950)

## 2017-01-09 LAB — COMPREHENSIVE METABOLIC PANEL
AG Ratio: 1.5 (calc) (ref 1.0–2.5)
ALT: 17 U/L (ref 6–29)
AST: 17 U/L (ref 10–35)
Albumin: 4.2 g/dL (ref 3.6–5.1)
Alkaline phosphatase (APISO): 83 U/L (ref 33–130)
BUN/Creatinine Ratio: 41 (calc) — ABNORMAL HIGH (ref 6–22)
BUN: 28 mg/dL — AB (ref 7–25)
CHLORIDE: 100 mmol/L (ref 98–110)
CO2: 29 mmol/L (ref 20–32)
CREATININE: 0.69 mg/dL (ref 0.50–0.99)
Calcium: 9.7 mg/dL (ref 8.6–10.4)
GLOBULIN: 2.8 g/dL (ref 1.9–3.7)
Glucose, Bld: 97 mg/dL (ref 65–99)
POTASSIUM: 4 mmol/L (ref 3.5–5.3)
SODIUM: 140 mmol/L (ref 135–146)
Total Bilirubin: 0.4 mg/dL (ref 0.2–1.2)
Total Protein: 7 g/dL (ref 6.1–8.1)

## 2017-01-09 LAB — LIPID PANEL
CHOL/HDL RATIO: 3.5 (calc) (ref <5.0)
Cholesterol: 212 mg/dL — ABNORMAL HIGH (ref <200)
HDL: 60 mg/dL (ref >50)
LDL CHOLESTEROL (CALC): 126 mg/dL — AB
NON-HDL CHOLESTEROL (CALC): 152 mg/dL — AB (ref <130)
Triglycerides: 148 mg/dL (ref <150)

## 2017-01-09 LAB — TSH: TSH: 2.4 m[IU]/L (ref 0.40–4.50)

## 2017-01-09 LAB — HEMOGLOBIN A1C
HEMOGLOBIN A1C: 5.5 %{Hb} (ref <5.7)
MEAN PLASMA GLUCOSE: 111 (calc)
eAG (mmol/L): 6.2 (calc)

## 2017-02-06 ENCOUNTER — Encounter: Payer: Self-pay | Admitting: Physician Assistant

## 2017-02-06 DIAGNOSIS — J069 Acute upper respiratory infection, unspecified: Secondary | ICD-10-CM

## 2017-02-06 MED ORDER — DOXYCYCLINE HYCLATE 100 MG PO TABS: 100.0000 mg | ORAL_TABLET | Freq: Two times a day (BID) | ORAL | 0 refills | Status: DC

## 2017-07-03 ENCOUNTER — Other Ambulatory Visit: Payer: Self-pay | Admitting: Physician Assistant

## 2017-07-03 DIAGNOSIS — Z1231 Encounter for screening mammogram for malignant neoplasm of breast: Secondary | ICD-10-CM

## 2017-08-06 ENCOUNTER — Ambulatory Visit
Admission: RE | Admit: 2017-08-06 | Discharge: 2017-08-06 | Disposition: A | Discharge status: Home / Self Care | Payer: Managed Care, Other (non HMO) | Source: Ambulatory Visit | Attending: Physician Assistant | Admitting: Physician Assistant

## 2017-08-06 ENCOUNTER — Other Ambulatory Visit: Payer: Self-pay | Admitting: Physician Assistant

## 2017-08-06 DIAGNOSIS — Z1231 Encounter for screening mammogram for malignant neoplasm of breast: Secondary | ICD-10-CM

## 2017-08-19 ENCOUNTER — Other Ambulatory Visit: Payer: Self-pay | Admitting: Physician Assistant

## 2017-08-19 DIAGNOSIS — I10 Essential (primary) hypertension: Secondary | ICD-10-CM

## 2018-01-14 ENCOUNTER — Encounter: Payer: Self-pay | Admitting: Physician Assistant

## 2018-02-17 ENCOUNTER — Ambulatory Visit (INDEPENDENT_AMBULATORY_CARE_PROVIDER_SITE_OTHER): Payer: Managed Care, Other (non HMO) | Admitting: Physician Assistant

## 2018-02-17 ENCOUNTER — Encounter: Payer: Self-pay | Admitting: Physician Assistant

## 2018-02-17 ENCOUNTER — Other Ambulatory Visit (HOSPITAL_COMMUNITY)
Admission: RE | Admit: 2018-02-17 | Discharge: 2018-02-17 | Disposition: A | Discharge status: Home / Self Care | Payer: Managed Care, Other (non HMO) | Source: Ambulatory Visit | Attending: Physician Assistant | Admitting: Physician Assistant

## 2018-02-17 VITALS — BP 138/88 | HR 69 | Temp 97.8°F | Resp 16 | Ht 64.0 in | Wt 200.8 lb

## 2018-02-17 DIAGNOSIS — Z114 Encounter for screening for human immunodeficiency virus [HIV]: Secondary | ICD-10-CM

## 2018-02-17 DIAGNOSIS — Z8742 Personal history of other diseases of the female genital tract: Secondary | ICD-10-CM | POA: Insufficient documentation

## 2018-02-17 DIAGNOSIS — Z1239 Encounter for other screening for malignant neoplasm of breast: Secondary | ICD-10-CM | POA: Diagnosis not present

## 2018-02-17 DIAGNOSIS — Z131 Encounter for screening for diabetes mellitus: Secondary | ICD-10-CM | POA: Diagnosis not present

## 2018-02-17 DIAGNOSIS — Z0000 Encounter for general adult medical examination without abnormal findings: Secondary | ICD-10-CM

## 2018-02-17 DIAGNOSIS — Z136 Encounter for screening for cardiovascular disorders: Secondary | ICD-10-CM

## 2018-02-17 DIAGNOSIS — I10 Essential (primary) hypertension: Secondary | ICD-10-CM

## 2018-02-17 DIAGNOSIS — Z23 Encounter for immunization: Secondary | ICD-10-CM

## 2018-02-17 DIAGNOSIS — Z90710 Acquired absence of both cervix and uterus: Secondary | ICD-10-CM

## 2018-02-17 DIAGNOSIS — Z1272 Encounter for screening for malignant neoplasm of vagina: Secondary | ICD-10-CM | POA: Diagnosis not present

## 2018-02-17 DIAGNOSIS — Z6834 Body mass index (BMI) 34.0-34.9, adult: Secondary | ICD-10-CM

## 2018-02-17 DIAGNOSIS — Z13220 Encounter for screening for lipoid disorders: Secondary | ICD-10-CM

## 2018-02-17 DIAGNOSIS — Z1151 Encounter for screening for human papillomavirus (HPV): Secondary | ICD-10-CM | POA: Insufficient documentation

## 2018-02-17 MED ORDER — TRIAMTERENE-HCTZ 37.5-25 MG PO CAPS: 1.0000 | ORAL_CAPSULE | Freq: Every day | ORAL | 3 refills | Status: DC

## 2018-02-17 MED ORDER — POTASSIUM CHLORIDE CRYS ER 20 MEQ PO TBCR: 20.0000 meq | EXTENDED_RELEASE_TABLET | Freq: Every day | ORAL | 3 refills | Status: DC

## 2018-02-17 MED ORDER — LOSARTAN POTASSIUM 100 MG PO TABS: 100.0000 mg | ORAL_TABLET | Freq: Every day | ORAL | 3 refills | Status: DC

## 2018-02-17 NOTE — Patient Instructions (Signed)
Health Maintenance for Postmenopausal Women Menopause is a normal process in which your reproductive ability comes to an end. This process happens gradually over a span of months to years, usually between the ages of 22 and 9. Menopause is complete when you have missed 12 consecutive menstrual periods. It is important to talk with your health care provider about some of the most common conditions that affect postmenopausal women, such as heart disease, cancer, and bone loss (osteoporosis). Adopting a healthy lifestyle and getting preventive care can help to promote your health and wellness. Those actions can also lower your chances of developing some of these common conditions. What should I know about menopause? During menopause, you may experience a number of symptoms, such as:  Moderate-to-severe hot flashes.  Night sweats.  Decrease in sex drive.  Mood swings.  Headaches.  Tiredness.  Irritability.  Memory problems.  Insomnia.  Choosing to treat or not to treat menopausal changes is an individual decision that you make with your health care provider. What should I know about hormone replacement therapy and supplements? Hormone therapy products are effective for treating symptoms that are associated with menopause, such as hot flashes and night sweats. Hormone replacement carries certain risks, especially as you become older. If you are thinking about using estrogen or estrogen with progestin treatments, discuss the benefits and risks with your health care provider. What should I know about heart disease and stroke? Heart disease, heart attack, and stroke become more likely as you age. This may be due, in part, to the hormonal changes that your body experiences during menopause. These can affect how your body processes dietary fats, triglycerides, and cholesterol. Heart attack and stroke are both medical emergencies. There are many things that you can do to help prevent heart disease  and stroke:  Have your blood pressure checked at least every 1-2 years. High blood pressure causes heart disease and increases the risk of stroke.  If you are 53-22 years old, ask your health care provider if you should take aspirin to prevent a heart attack or a stroke.  Do not use any tobacco products, including cigarettes, chewing tobacco, or electronic cigarettes. If you need help quitting, ask your health care provider.  It is important to eat a healthy diet and maintain a healthy weight. ? Be sure to include plenty of vegetables, fruits, low-fat dairy products, and lean protein. ? Avoid eating foods that are high in solid fats, added sugars, or salt (sodium).  Get regular exercise. This is one of the most important things that you can do for your health. ? Try to exercise for at least 150 minutes each week. The type of exercise that you do should increase your heart rate and make you sweat. This is known as moderate-intensity exercise. ? Try to do strengthening exercises at least twice each week. Do these in addition to the moderate-intensity exercise.  Know your numbers.Ask your health care provider to check your cholesterol and your blood glucose. Continue to have your blood tested as directed by your health care provider.  What should I know about cancer screening? There are several types of cancer. Take the following steps to reduce your risk and to catch any cancer development as early as possible. Breast Cancer  Practice breast self-awareness. ? This means understanding how your breasts normally appear and feel. ? It also means doing regular breast self-exams. Let your health care provider know about any changes, no matter how small.  If you are 40  or older, have a clinician do a breast exam (clinical breast exam or CBE) every year. Depending on your age, family history, and medical history, it may be recommended that you also have a yearly breast X-ray (mammogram).  If you  have a family history of breast cancer, talk with your health care provider about genetic screening.  If you are at high risk for breast cancer, talk with your health care provider about having an MRI and a mammogram every year.  Breast cancer (BRCA) gene test is recommended for women who have family members with BRCA-related cancers. Results of the assessment will determine the need for genetic counseling and BRCA1 and for BRCA2 testing. BRCA-related cancers include these types: ? Breast. This occurs in males or females. ? Ovarian. ? Tubal. This may also be called fallopian tube cancer. ? Cancer of the abdominal or pelvic lining (peritoneal cancer). ? Prostate. ? Pancreatic.  Cervical, Uterine, and Ovarian Cancer Your health care provider may recommend that you be screened regularly for cancer of the pelvic organs. These include your ovaries, uterus, and vagina. This screening involves a pelvic exam, which includes checking for microscopic changes to the surface of your cervix (Pap test).  For women ages 21-65, health care providers may recommend a pelvic exam and a Pap test every three years. For women ages 79-65, they may recommend the Pap test and pelvic exam, combined with testing for human papilloma virus (HPV), every five years. Some types of HPV increase your risk of cervical cancer. Testing for HPV may also be done on women of any age who have unclear Pap test results.  Other health care providers may not recommend any screening for nonpregnant women who are considered low risk for pelvic cancer and have no symptoms. Ask your health care provider if a screening pelvic exam is right for you.  If you have had past treatment for cervical cancer or a condition that could lead to cancer, you need Pap tests and screening for cancer for at least 20 years after your treatment. If Pap tests have been discontinued for you, your risk factors (such as having a new sexual partner) need to be  reassessed to determine if you should start having screenings again. Some women have medical problems that increase the chance of getting cervical cancer. In these cases, your health care provider may recommend that you have screening and Pap tests more often.  If you have a family history of uterine cancer or ovarian cancer, talk with your health care provider about genetic screening.  If you have vaginal bleeding after reaching menopause, tell your health care provider.  There are currently no reliable tests available to screen for ovarian cancer.  Lung Cancer Lung cancer screening is recommended for adults 69-62 years old who are at high risk for lung cancer because of a history of smoking. A yearly low-dose CT scan of the lungs is recommended if you:  Currently smoke.  Have a history of at least 30 pack-years of smoking and you currently smoke or have quit within the past 15 years. A pack-year is smoking an average of one pack of cigarettes per day for one year.  Yearly screening should:  Continue until it has been 15 years since you quit.  Stop if you develop a health problem that would prevent you from having lung cancer treatment.  Colorectal Cancer  This type of cancer can be detected and can often be prevented.  Routine colorectal cancer screening usually begins at  age 42 and continues through age 45.  If you have risk factors for colon cancer, your health care provider may recommend that you be screened at an earlier age.  If you have a family history of colorectal cancer, talk with your health care provider about genetic screening.  Your health care provider may also recommend using home test kits to check for hidden blood in your stool.  A small camera at the end of a tube can be used to examine your colon directly (sigmoidoscopy or colonoscopy). This is done to check for the earliest forms of colorectal cancer.  Direct examination of the colon should be repeated every  5-10 years until age 71. However, if early forms of precancerous polyps or small growths are found or if you have a family history or genetic risk for colorectal cancer, you may need to be screened more often.  Skin Cancer  Check your skin from head to toe regularly.  Monitor any moles. Be sure to tell your health care provider: ? About any new moles or changes in moles, especially if there is a change in a mole's shape or color. ? If you have a mole that is larger than the size of a pencil eraser.  If any of your family members has a history of skin cancer, especially at a young age, talk with your health care provider about genetic screening.  Always use sunscreen. Apply sunscreen liberally and repeatedly throughout the day.  Whenever you are outside, protect yourself by wearing long sleeves, pants, a wide-brimmed hat, and sunglasses.  What should I know about osteoporosis? Osteoporosis is a condition in which bone destruction happens more quickly than new bone creation. After menopause, you may be at an increased risk for osteoporosis. To help prevent osteoporosis or the bone fractures that can happen because of osteoporosis, the following is recommended:  If you are 46-71 years old, get at least 1,000 mg of calcium and at least 600 mg of vitamin D per day.  If you are older than age 55 but younger than age 65, get at least 1,200 mg of calcium and at least 600 mg of vitamin D per day.  If you are older than age 54, get at least 1,200 mg of calcium and at least 800 mg of vitamin D per day.  Smoking and excessive alcohol intake increase the risk of osteoporosis. Eat foods that are rich in calcium and vitamin D, and do weight-bearing exercises several times each week as directed by your health care provider. What should I know about how menopause affects my mental health? Depression may occur at any age, but it is more common as you become older. Common symptoms of depression  include:  Low or sad mood.  Changes in sleep patterns.  Changes in appetite or eating patterns.  Feeling an overall lack of motivation or enjoyment of activities that you previously enjoyed.  Frequent crying spells.  Talk with your health care provider if you think that you are experiencing depression. What should I know about immunizations? It is important that you get and maintain your immunizations. These include:  Tetanus, diphtheria, and pertussis (Tdap) booster vaccine.  Influenza every year before the flu season begins.  Pneumonia vaccine.  Shingles vaccine.  Your health care provider may also recommend other immunizations. This information is not intended to replace advice given to you by your health care provider. Make sure you discuss any questions you have with your health care provider. Document Released: 05/25/2005  Document Revised: 10/21/2015 Document Reviewed: 01/04/2015 Elsevier Interactive Patient Education  2018 [employer]  

## 2018-02-17 NOTE — Progress Notes (Addendum)
Patient: Christina Conway, Female    DOB: 12/04/1950, 67 y.o.   MRN: [id number] Visit Date: 02/17/2018  Today's Provider: Mar Daring, PA-C   Chief Complaint  Patient presents with  . Annual Exam   Subjective:    Annual physical exam Christina Conway is a 67 y.o. female who presents today for health maintenance and complete physical. She feels well. She reports exercising. She reports she is sleeping well.  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  Review of Systems  Constitutional: Positive for fatigue.  HENT: Negative.   Eyes: Negative.   Respiratory: Negative.   Cardiovascular: Negative.   Gastrointestinal: Negative.   Endocrine: Negative.   Genitourinary: Negative.   Musculoskeletal: Negative.   Skin: Negative.   Allergic/Immunologic: Negative.   Neurological: Negative.   Hematological: Negative.   Psychiatric/Behavioral: Negative.     Social History      She  reports that she has never smoked. She has never used smokeless tobacco. She reports that she does not drink alcohol or use drugs.       Social History   Socioeconomic History  . Marital status: Married    Spouse name: Not on file  . Number of children: 1  . Years of education: 33  . Highest education level: Not on file  Occupational History  . Not on file  Social Needs  . Financial resource strain: Not on file  . Food insecurity:    Worry: Not on file    Inability: Not on file  . Transportation needs:    Medical: Not on file    Non-medical: Not on file  Tobacco Use  . Smoking status: Never Smoker  . Smokeless tobacco: Never Used  Substance and Sexual Activity  . Alcohol use: No    Comment: 1-2 a month  . Drug use: No  . Sexual activity: Not on file  Lifestyle  . Physical activity:    Days per week: Not on file   Minutes per session: Not on file  . Stress: Not on file  Relationships  . Social connections:    Talks on phone: Not on file    Gets together: Not on file    Attends religious service: Not on file    Active member of club or organization: Not on file    Attends meetings of clubs or organizations: Not on file    Relationship status: Not on file  Other Topics Concern  . Not on file  Social History Narrative  . Not on file    Past Medical History:  Diagnosis Date  . Arthritis   . Chicken pox   . GERD (gastroesophageal reflux disease)   . Headache(784.0)   . Hyperlipidemia   . Hypertension      Patient Active Problem List   Diagnosis Date Noted  . Facial rhytids 03/08/2015  . Obesity (BMI 30-39.9) 11/18/2011  . Routine general medical examination at a health care facility 11/16/2011  . Hypertension 11/16/2011  . Contact dermatitis 11/16/2011  . Benign colonic polyp 11/16/2011    Past Surgical History:  Procedure Laterality Date  . ABDOMINAL HYSTERECTOMY  08/02/1997  . bunion    . BUNIONECTOMY  06/03/1998 & 06/04/2003  . HAND SURGERY  09/01/2002   righ hand    Family History        Family Status  Relation Name Status  .  Mother  Deceased at age 12       congestive health failure  . Father  Deceased at age 75       Congestive Heart Failure  . Sister  Alive        Her family history includes Arthritis in her father and mother; Heart disease in her father and mother; Heart murmur in her father and mother; Hypertension in her father and mother.      Allergies  Allergen Reactions  . Penicillins Rash     Current Outpatient Medications:  .  ALPRAZolam (XANAX) 0.25 MG tablet, Take 1 tablet (0.25 mg total) by mouth at bedtime as needed., Disp: 30 tablet, Rfl: 5 .  KLOR-CON M20 20 MEQ tablet, TAKE 1 TABLET BY MOUTH DAILY., Disp: 30 tablet, Rfl: 6 .  losartan (COZAAR) 100 MG tablet, TAKE 1 TABLET BY MOUTH EVERY DAY, Disp: 30 tablet, Rfl: 5 .   triamterene-hydrochlorothiazide (DYAZIDE) 37.5-25 MG capsule, TAKE 1 EACH (1 CAPSULE TOTAL) BY MOUTH EVERY MORNING., Disp: 30 capsule, Rfl: 5 .  doxycycline (VIBRA-TABS) 100 MG tablet, Take 1 tablet (100 mg total) by mouth 2 (two) times daily. (Patient not taking: Reported on 02/17/2018), Disp: 20 tablet, Rfl: 0 .  meloxicam (MOBIC) 15 MG tablet, Take 1 tablet (15 mg total) by mouth daily. (Patient not taking: Reported on 02/17/2018), Disp: 30 tablet, Rfl: 2 .  nystatin cream (MYCOSTATIN), Apply 1 application topically 2 (two) times daily. (Patient not taking: Reported on 02/17/2018), Disp: 30 g, Rfl: 0   Patient Care Team: Rubye Beach as PCP - General (Physician Assistant)      Objective:   Vitals: BP 138/88 (BP Location: Left Arm, Patient Position: Sitting, Cuff Size: Normal)   Pulse 69   Temp 97.8 F (36.6 C) (Oral)   Resp 16   Ht 5\' 4"  (1.626 m)   Wt 200 lb 12.8 oz (91.1 kg)   BMI 34.47 kg/m    Vitals:   02/17/18 1617  BP: 138/88  Pulse: 69  Resp: 16  Temp: 97.8 F (36.6 C)  TempSrc: Oral  Weight: 200 lb 12.8 oz (91.1 kg)  Height: 5\' 4"  (1.626 m)     Physical Exam Vitals signs reviewed. Exam conducted with a chaperone present.  Constitutional:      General: She is not in acute distress.    Appearance: She is well-developed and well-nourished. She is not diaphoretic.  HENT:     Head: Normocephalic and atraumatic.     Right Ear: Hearing, tympanic membrane, ear canal and external ear normal.     Left Ear: Hearing, tympanic membrane, ear canal and external ear normal.     Nose: Nose normal.     Mouth/Throat:     Mouth: Oropharynx is clear and moist and mucous membranes are normal.     Pharynx: Uvula midline. No oropharyngeal exudate.  Eyes:     General: No scleral icterus.       Right eye: No discharge.        Left eye: No discharge.     Extraocular Movements: EOM normal.     Conjunctiva/sclera: Conjunctivae normal.     Pupils: Pupils are equal, round,  and reactive to light.  Neck:     Musculoskeletal: Normal range of motion and neck supple.     Thyroid: No thyromegaly.     Vascular: No carotid bruit or JVD.     Trachea: No tracheal deviation.  Cardiovascular:     Rate and Rhythm:  Normal rate and regular rhythm.     Pulses: Intact distal pulses.     Heart sounds: Normal heart sounds. No murmur. No friction rub. No gallop.   Pulmonary:     Effort: Pulmonary effort is normal. No respiratory distress.     Breath sounds: Normal breath sounds. No wheezing or rales.  Chest:     Chest wall: No tenderness.     Breasts: No discharge from either breast. No tenderness and bleeding. Breasts are symmetrical.        Right: No inverted nipple, mass, nipple discharge, skin change or tenderness.        Left: No inverted nipple, mass, nipple discharge, skin change or tenderness.  Abdominal:     General: Bowel sounds are normal. There is no distension.     Palpations: Abdomen is soft. There is no mass.     Tenderness: There is no abdominal tenderness. There is no guarding or rebound.     Hernia: There is no hernia in the right inguinal area or left inguinal area.  Genitourinary:    Exam position: Supine.     Labia:        Right: No rash, tenderness, lesion or injury.        Left: No rash, tenderness, lesion or injury.      Vagina: No signs of injury. Vaginal discharge (white discharge noted, possibly yeast) present. No erythema, tenderness or bleeding.     Adnexa:        Right: No mass, tenderness or fullness.         Left: No mass, tenderness or fullness.       Rectum: Normal.  Musculoskeletal: Normal range of motion.        General: No tenderness or edema.  Lymphadenopathy:     Cervical: No cervical adenopathy.     Lower Body: No right inguinal adenopathy. No left inguinal adenopathy.  Skin:    General: Skin is warm and dry.     Findings: No rash.  Neurological:     Mental Status: She is alert and oriented to person, place, and time.      Cranial Nerves: No cranial nerve deficit.     Coordination: Coordination normal.     Deep Tendon Reflexes: Reflexes are normal and symmetric.  Psychiatric:        Mood and Affect: Mood and affect normal.        Behavior: Behavior normal.        Thought Content: Thought content normal.        Judgment: Judgment normal.      Depression Screen PHQ 2/9 Scores 02/17/2018 01/08/2017 11/24/2014  PHQ - 2 Score 0 0 0      Assessment & Plan:     Routine Health Maintenance and Physical Exam  Exercise Activities and Dietary recommendations Goals   None      There is no immunization history on file for this patient.  Health Maintenance  Topic Date Due  . HIV Screening  09/15/1967  . TETANUS/TDAP  09/15/1971  . PNA vac Low Risk Adult (1 of 2 - PCV13) 09/14/2017  . INFLUENZA VACCINE  11/14/2017  . PAP SMEAR  11/23/2017  . MAMMOGRAM  08/07/2019  . COLONOSCOPY  01/28/2023  . DEXA SCAN  Completed  . Hepatitis C Screening  Completed     Discussed health benefits of physical activity, and encouraged her to engage in regular exercise appropriate for her age and condition.    1. Annual  physical exam Normal physical exam today. Will check labs as below and f/u pending lab results. If labs are stable and WNL she will not need to have these rechecked for one year at her next annual physical exam. She is to call the office in the meantime if she has any acute issue, questions or concerns. - CBC with Differential/Platelet - Comprehensive metabolic panel - TSH  2. Breast cancer screening Breast exam today was normal. There is no family history of breast cancer. She does perform regular self breast exams. Mammogram was ordered as below. Information for [facility] was given to patient so she may schedule her mammogram at her convenience.  3. Screening for vaginal cancer Pap collected today. Will send as below and f/u pending results. - Cytology - PAP  4. Encounter for  screening for diabetes mellitus A1c ordered. Will check labs as below and f/u pending results. - Hemoglobin A1c  5. Encounter for lipid screening for cardiovascular disease Will check labs as below and f/u pending results. - Lipid panel  6. Essential hypertension Stable. Diagnosis pulled for medication refill. Continue current medical treatment plan. Will check labs as below and f/u pending results. - CBC with Differential/Platelet - Comprehensive metabolic panel - Hemoglobin A1c - potassium chloride SA (KLOR-CON M20) 20 MEQ tablet; Take 1 tablet (20 mEq total) by mouth daily.  Dispense: 90 tablet; Refill: 3 - triamterene-hydrochlorothiazide (DYAZIDE) 37.5-25 MG capsule; Take 1 each (1 capsule total) by mouth daily.  Dispense: 90 capsule; Refill: 3 - losartan (COZAAR) 100 MG tablet; Take 1 tablet (100 mg total) by mouth daily.  Dispense: 90 tablet; Refill: 3  7. Screening for HIV without presence of risk factors Will check labs as below and f/u pending results. - HIV Antibody (routine testing w rflx)  8. BMI 34.0-34.9,adult Counseled patient on healthy lifestyle modifications including dieting and exercise.  - CBC with Differential/Platelet - Comprehensive metabolic panel  9. Need for pneumococcal vaccine Prevnar 13 vaccine given to patient without complications. Patient sat for 15 minutes after administration and was tolerated well without adverse effects. - Pneumococcal conjugate vaccine 13-valent IM  10. Need for Tdap vaccination Tdap Vaccine given to patient without complications. Patient sat for 15 minutes after administration and was tolerated well without adverse effects. - Tdap vaccine greater than or equal to 7yo IM  --------------------------------------------------------------------    Mar Daring, PA-C  [facility]

## 2018-02-19 LAB — CYTOLOGY - PAP
DIAGNOSIS: NEGATIVE
HPV (WINDOPATH): NOT DETECTED

## 2018-02-21 ENCOUNTER — Telehealth: Payer: Self-pay

## 2018-02-21 NOTE — Telephone Encounter (Signed)
Patient was advised.  

## 2018-02-21 NOTE — Telephone Encounter (Signed)
-----   Message from Mar Daring, [place] sent at 02/19/2018  6:04 PM EST ----- Pap is normal, HPV negative.  Will repeat in 3-5 years if desired.

## 2018-02-26 ENCOUNTER — Telehealth: Payer: Self-pay

## 2018-02-26 LAB — CBC WITH DIFFERENTIAL/PLATELET
BASOS: 1 %
Basophils Absolute: 0 10*3/uL (ref 0.0–0.2)
EOS (ABSOLUTE): 0.1 10*3/uL (ref 0.0–0.4)
EOS: 2 %
HEMATOCRIT: 41.1 % (ref 34.0–46.6)
HEMOGLOBIN: 13.4 g/dL (ref 11.1–15.9)
IMMATURE GRANS (ABS): 0 10*3/uL (ref 0.0–0.1)
Immature Granulocytes: 0 %
LYMPHS ABS: 2.2 10*3/uL (ref 0.7–3.1)
LYMPHS: 34 %
MCH: 32.2 pg (ref 26.6–33.0)
MCHC: 32.6 g/dL (ref 31.5–35.7)
MCV: 99 fL — AB (ref 79–97)
MONOCYTES: 7 %
Monocytes Absolute: 0.5 10*3/uL (ref 0.1–0.9)
NEUTROS ABS: 3.6 10*3/uL (ref 1.4–7.0)
Neutrophils: 56 %
PLATELETS: 324 10*3/uL (ref 150–450)
RBC: 4.16 x10E6/uL (ref 3.77–5.28)
RDW: 12.2 % — ABNORMAL LOW (ref 12.3–15.4)
WBC: 6.5 10*3/uL (ref 3.4–10.8)

## 2018-02-26 LAB — COMPREHENSIVE METABOLIC PANEL
A/G RATIO: 1.7 (ref 1.2–2.2)
ALBUMIN: 4.4 g/dL (ref 3.6–4.8)
ALT: 24 IU/L (ref 0–32)
AST: 22 IU/L (ref 0–40)
Alkaline Phosphatase: 98 IU/L (ref 39–117)
BUN / CREAT RATIO: 26 (ref 12–28)
BUN: 23 mg/dL (ref 8–27)
CALCIUM: 10.2 mg/dL (ref 8.7–10.3)
CO2: 23 mmol/L (ref 20–29)
Chloride: 98 mmol/L (ref 96–106)
Creatinine, Ser: 0.87 mg/dL (ref 0.57–1.00)
GFR calc non Af Amer: 70 mL/min/{1.73_m2} (ref >59)
GFR, EST AFRICAN AMERICAN: 81 mL/min/{1.73_m2} (ref >59)
GLOBULIN, TOTAL: 2.6 g/dL (ref 1.5–4.5)
Glucose: 97 mg/dL (ref 65–99)
POTASSIUM: 4.6 mmol/L (ref 3.5–5.2)
Sodium: 138 mmol/L (ref 134–144)
TOTAL PROTEIN: 7 g/dL (ref 6.0–8.5)

## 2018-02-26 LAB — LIPID PANEL
CHOLESTEROL TOTAL: 218 mg/dL — AB (ref 100–199)
Chol/HDL Ratio: 4.5 ratio — ABNORMAL HIGH (ref 0.0–4.4)
HDL: 48 mg/dL (ref >39)
LDL CALC: 128 mg/dL — AB (ref 0–99)
Triglycerides: 208 mg/dL — ABNORMAL HIGH (ref 0–149)
VLDL Cholesterol Cal: 42 mg/dL — ABNORMAL HIGH (ref 5–40)

## 2018-02-26 LAB — HIV ANTIBODY (ROUTINE TESTING W REFLEX): HIV SCREEN 4TH GENERATION: NONREACTIVE

## 2018-02-26 LAB — HEMOGLOBIN A1C
Est. average glucose Bld gHb Est-mCnc: 114 mg/dL
Hgb A1c MFr Bld: 5.6 % (ref 4.8–5.6)

## 2018-02-26 LAB — TSH: TSH: 5.15 u[IU]/mL — ABNORMAL HIGH (ref 0.450–4.500)

## 2018-02-26 NOTE — Telephone Encounter (Signed)
-----   Message from Mar Daring, [place] sent at 02/26/2018  7:50 AM EST ----- Blood count is normal. Kidney and liver function normal. Sugar normal. Cholesterol still borderline high but much improved since last year. Thyroid slightly underactive. Will monitor. HIV screen is negative. This is done just once in a lifetime.

## 2018-02-26 NOTE — Telephone Encounter (Signed)
Patient was advised.  

## 2018-02-27 ENCOUNTER — Encounter: Payer: Self-pay | Admitting: Physician Assistant

## 2018-02-27 DIAGNOSIS — F419 Anxiety disorder, unspecified: Secondary | ICD-10-CM

## 2018-02-28 MED ORDER — ALPRAZOLAM 0.25 MG PO TABS: 0.2500 mg | ORAL_TABLET | Freq: Every evening | ORAL | 5 refills | Status: DC | PRN

## 2018-06-24 ENCOUNTER — Telehealth: Payer: Self-pay | Admitting: Physician Assistant

## 2018-06-24 NOTE — Telephone Encounter (Signed)
Please advise 

## 2018-06-24 NOTE — Telephone Encounter (Signed)
Christina Conway is calling to see if [name] has written the letter to have her pap smear refiled through [facility] lab with different Diagnosis code.  Please advise patient if letter has been sent.

## 2018-06-25 NOTE — Telephone Encounter (Signed)
Letter faxed to  [employer] 336 [phone]

## 2018-06-25 NOTE — Telephone Encounter (Signed)
Letter printed.   Christina Conway could you remind me the ladies name or fax number we are to send the letter to.  Thanks and sorry I forgot!

## 2018-06-25 NOTE — Telephone Encounter (Signed)
Thanks Arbie Cookey! You are the best!

## 2018-06-25 NOTE — Telephone Encounter (Signed)
Christina Conway in [facility]   Fax # [phone]

## 2018-06-26 ENCOUNTER — Encounter: Payer: Self-pay | Admitting: Physician Assistant

## 2018-07-04 ENCOUNTER — Telehealth: Payer: Self-pay | Admitting: Physician Assistant

## 2018-07-04 NOTE — Telephone Encounter (Signed)
Christina Conway -  [facility] has a questions on medical records request on pt.  Please call [phone]  Thanks, [facility]

## 2018-09-10 ENCOUNTER — Other Ambulatory Visit: Payer: Self-pay | Admitting: Physician Assistant

## 2018-09-10 DIAGNOSIS — Z1231 Encounter for screening mammogram for malignant neoplasm of breast: Secondary | ICD-10-CM

## 2018-10-24 ENCOUNTER — Ambulatory Visit
Admission: RE | Admit: 2018-10-24 | Discharge: 2018-10-24 | Disposition: A | Discharge status: Home / Self Care | Payer: 59 | Source: Ambulatory Visit | Attending: Physician Assistant | Admitting: Physician Assistant

## 2018-10-24 ENCOUNTER — Other Ambulatory Visit: Payer: Self-pay

## 2018-10-24 DIAGNOSIS — Z1231 Encounter for screening mammogram for malignant neoplasm of breast: Secondary | ICD-10-CM | POA: Diagnosis not present

## 2018-10-27 ENCOUNTER — Telehealth: Payer: Self-pay

## 2018-10-27 NOTE — Telephone Encounter (Signed)
Viewed by Tildon Husky on 10/24/2018 7:03 PM Written by Mar Daring, PA-C on 10/24/2018 4:13 PM Normal mammogram. Repeat screening in one year.

## 2018-10-27 NOTE — Telephone Encounter (Signed)
-----   Message from Mar Daring, [place] sent at 10/24/2018  4:13 PM EDT ----- Normal mammogram. Repeat screening in one year.

## 2019-01-01 DIAGNOSIS — Z09 Encounter for follow-up examination after completed treatment for conditions other than malignant neoplasm: Secondary | ICD-10-CM | POA: Diagnosis not present

## 2019-01-01 DIAGNOSIS — L570 Actinic keratosis: Secondary | ICD-10-CM | POA: Diagnosis not present

## 2019-01-01 DIAGNOSIS — Z85828 Personal history of other malignant neoplasm of skin: Secondary | ICD-10-CM | POA: Diagnosis not present

## 2019-01-01 DIAGNOSIS — X32XXXA Exposure to sunlight, initial encounter: Secondary | ICD-10-CM | POA: Diagnosis not present

## 2019-01-01 DIAGNOSIS — Z08 Encounter for follow-up examination after completed treatment for malignant neoplasm: Secondary | ICD-10-CM | POA: Diagnosis not present

## 2019-01-01 DIAGNOSIS — Z872 Personal history of diseases of the skin and subcutaneous tissue: Secondary | ICD-10-CM | POA: Diagnosis not present

## 2019-01-01 DIAGNOSIS — L821 Other seborrheic keratosis: Secondary | ICD-10-CM | POA: Diagnosis not present

## 2019-01-27 DIAGNOSIS — H2513 Age-related nuclear cataract, bilateral: Secondary | ICD-10-CM | POA: Diagnosis not present

## 2019-02-24 DIAGNOSIS — I10 Essential (primary) hypertension: Secondary | ICD-10-CM | POA: Diagnosis not present

## 2019-02-25 ENCOUNTER — Other Ambulatory Visit: Payer: Self-pay

## 2019-02-25 ENCOUNTER — Encounter: Payer: Self-pay | Admitting: *Deleted

## 2019-02-26 ENCOUNTER — Ambulatory Visit (INDEPENDENT_AMBULATORY_CARE_PROVIDER_SITE_OTHER): Payer: 59 | Admitting: Physician Assistant

## 2019-02-26 ENCOUNTER — Encounter: Payer: Self-pay | Admitting: Physician Assistant

## 2019-02-26 VITALS — BP 120/82 | HR 79 | Temp 96.9°F | Resp 16 | Ht 64.0 in | Wt 206.0 lb

## 2019-02-26 DIAGNOSIS — F419 Anxiety disorder, unspecified: Secondary | ICD-10-CM

## 2019-02-26 DIAGNOSIS — Z0000 Encounter for general adult medical examination without abnormal findings: Secondary | ICD-10-CM | POA: Diagnosis not present

## 2019-02-26 DIAGNOSIS — I10 Essential (primary) hypertension: Secondary | ICD-10-CM | POA: Diagnosis not present

## 2019-02-26 DIAGNOSIS — Z6835 Body mass index (BMI) 35.0-35.9, adult: Secondary | ICD-10-CM

## 2019-02-26 DIAGNOSIS — R69 Illness, unspecified: Secondary | ICD-10-CM | POA: Diagnosis not present

## 2019-02-26 DIAGNOSIS — E66812 Obesity, class 2: Secondary | ICD-10-CM

## 2019-02-26 MED ORDER — TRIAMTERENE-HCTZ 37.5-25 MG PO CAPS: 1.0000 | ORAL_CAPSULE | Freq: Every day | ORAL | 3 refills | Status: DC

## 2019-02-26 MED ORDER — LOSARTAN POTASSIUM 100 MG PO TABS: 100.0000 mg | ORAL_TABLET | Freq: Every day | ORAL | 3 refills | Status: DC

## 2019-02-26 MED ORDER — ALPRAZOLAM 0.25 MG PO TABS: 0.2500 mg | ORAL_TABLET | Freq: Every evening | ORAL | 5 refills | Status: DC | PRN

## 2019-02-26 NOTE — Progress Notes (Signed)
Patient: Christina Conway, Female    DOB: 14-Dec-1950, 68 y.o.   MRN: [id number] Visit Date: 02/26/2019  Today's Provider: Mar Daring, PA-C   Chief Complaint  Patient presents with  . Annual Exam   Subjective:     Complete Physical Christina Conway is a 68 y.o. female. She feels well. She reports no regular exercising. She reports she is sleeping fairly well. -----------------------------------------------------------   Review of Systems  Constitutional: Negative for chills, fatigue and fever.  HENT: Negative for congestion, ear pain, rhinorrhea, sneezing and sore throat.   Eyes: Negative.  Negative for pain and redness.  Respiratory: Negative for cough, shortness of breath and wheezing.   Cardiovascular: Negative for chest pain and leg swelling.  Gastrointestinal: Negative for abdominal pain, blood in stool, constipation, diarrhea and nausea.  Endocrine: Negative for polydipsia and polyphagia.  Genitourinary: Negative.  Negative for dysuria, flank pain, hematuria, pelvic pain, vaginal bleeding and vaginal discharge.  Musculoskeletal: Positive for arthralgias, back pain and myalgias. Negative for gait problem and joint swelling.  Skin: Negative for rash.  Neurological: Negative.  Negative for dizziness, tremors, seizures, weakness, light-headedness, numbness and headaches.  Hematological: Negative for adenopathy.  Psychiatric/Behavioral: Negative for behavioral problems, confusion and dysphoric mood. The patient is nervous/anxious. The patient is not hyperactive.     Social History   Socioeconomic History  . Marital status: Married    Spouse name: Not on file  . Number of children: 1  . Years of education: 24  . Highest education level: Not on file  Occupational History  . Not on file  Social Needs  . Financial resource strain: Not on file  . Food insecurity    Worry: Not on file    Inability: Not on file  . Transportation needs    Medical: Not on file   Non-medical: Not on file  Tobacco Use  . Smoking status: Never Smoker  . Smokeless tobacco: Never Used  Substance and Sexual Activity  . Alcohol use: Yes    Comment: 1-2 a month  . Drug use: No  . Sexual activity: Not on file  Lifestyle  . Physical activity    Days per week: Not on file    Minutes per session: Not on file  . Stress: Not on file  Relationships  . Social Herbalist on phone: Not on file    Gets together: Not on file    Attends religious service: Not on file    Active member of club or organization: Not on file    Attends meetings of clubs or organizations: Not on file    Relationship status: Not on file  . Intimate partner violence    Fear of current or ex partner: Not on file    Emotionally abused: Not on file    Physically abused: Not on file    Forced sexual activity: Not on file  Other Topics Concern  . Not on file  Social History Narrative  . Not on file    Past Medical History:  Diagnosis Date  . Arthritis    fingers  . Chicken pox   . Dental bridge present    Permanent dental retainer - bottom front  . GERD (gastroesophageal reflux disease)   . Headache(784.0)   . Hyperlipidemia   . Hypertension   . Vertigo    no episodes for over 10 years     Patient Active Problem List   Diagnosis  Date Noted  . Facial rhytids 03/08/2015  . Obesity (BMI 30-39.9) 11/18/2011  . Hypertension 11/16/2011  . Contact dermatitis 11/16/2011  . Benign colonic polyp 11/16/2011    Past Surgical History:  Procedure Laterality Date  . ABDOMINAL HYSTERECTOMY  08/02/1997  . bunion    . BUNIONECTOMY  06/03/1998 & 06/04/2003  . HAND SURGERY  09/01/2002   righ hand    Her family history includes Arthritis in her father and mother; Heart disease in her father and mother; Heart murmur in her father and mother; Hypertension in her father and mother. There is no history of Breast cancer.   Current Outpatient Medications:  .  ALPRAZolam (XANAX) 0.25 MG  tablet, Take 1 tablet (0.25 mg total) by mouth at bedtime as needed., Disp: 30 tablet, Rfl: 5 .  losartan (COZAAR) 100 MG tablet, Take 1 tablet (100 mg total) by mouth daily., Disp: 90 tablet, Rfl: 3 .  triamterene-hydrochlorothiazide (DYAZIDE) 37.5-25 MG capsule, Take 1 each (1 capsule total) by mouth daily., Disp: 90 capsule, Rfl: 3 .  TURMERIC CURCUMIN PO, Take by mouth daily., Disp: , Rfl:  .  potassium chloride SA (KLOR-CON M20) 20 MEQ tablet, Take 1 tablet (20 mEq total) by mouth daily. (Patient not taking: Reported on 02/25/2019), Disp: 90 tablet, Rfl: 3  Patient Care Team: Mar Daring, PA-C as PCP - General (Physician Assistant)     Objective:    Vitals: BP 120/82 (BP Location: Left Arm, Patient Position: Sitting, Cuff Size: Large)   Pulse 79   Temp (!) 96.9 F (36.1 C) (Temporal)   Resp 16   Ht 5\' 4"  (1.626 m)   Wt 206 lb (93.4 kg)   SpO2 98% Comment: room air  BMI 35.36 kg/m   Physical Exam Vitals signs reviewed.  Constitutional:      General: She is not in acute distress.    Appearance: Normal appearance. She is well-developed. She is obese. She is not ill-appearing or diaphoretic.  HENT:     Head: Normocephalic and atraumatic.     Right Ear: Tympanic membrane, ear canal and external ear normal.     Left Ear: Tympanic membrane, ear canal and external ear normal.     Nose: Nose normal.     Mouth/Throat:     Mouth: Mucous membranes are moist.     Pharynx: Oropharynx is clear. No oropharyngeal exudate.  Eyes:     General: No scleral icterus.       Right eye: No discharge.        Left eye: No discharge.     Extraocular Movements: Extraocular movements intact.     Conjunctiva/sclera: Conjunctivae normal.     Pupils: Pupils are equal, round, and reactive to light.  Neck:     Musculoskeletal: Normal range of motion and neck supple.     Thyroid: No thyromegaly.     Vascular: No JVD.     Trachea: No tracheal deviation.  Cardiovascular:     Rate and Rhythm:  Normal rate and regular rhythm.     Pulses: Normal pulses.     Heart sounds: Normal heart sounds. No murmur. No friction rub. No gallop.   Pulmonary:     Effort: No respiratory distress.     Breath sounds: Normal breath sounds. No wheezing or rales.  Chest:     Chest wall: No tenderness.     Breasts:        Right: Normal. No mass.        Left:  Normal. No mass.  Abdominal:     General: Abdomen is protuberant. Bowel sounds are normal. There is no distension.     Palpations: Abdomen is soft. There is no mass.     Tenderness: There is no abdominal tenderness. There is no guarding or rebound.  Musculoskeletal: Normal range of motion.        General: No tenderness.  Lymphadenopathy:     Cervical: No cervical adenopathy.  Skin:    General: Skin is warm and dry.     Capillary Refill: Capillary refill takes less than 2 seconds.     Findings: No rash.  Neurological:     General: No focal deficit present.     Mental Status: She is alert and oriented to person, place, and time. Mental status is at baseline.  Psychiatric:        Mood and Affect: Mood normal.        Behavior: Behavior normal.        Thought Content: Thought content normal.        Judgment: Judgment normal.     Activities of Daily Living In your present state of health, do you have any difficulty performing the following activities: 02/26/2019 02/26/2019  Hearing? N N  Vision? N N  Difficulty concentrating or making decisions? N N  Walking or climbing stairs? N N  Dressing or bathing? N N  Doing errands, shopping? N N  Some recent data might be hidden    Fall Risk Assessment Fall Risk  02/26/2019  Falls in the past year? 0  Number falls in past yr: 0  Injury with Fall? 0  Follow up Falls evaluation completed     Depression Screen PHQ 2/9 Scores 02/26/2019 02/17/2018 01/08/2017 11/24/2014  PHQ - 2 Score 0 0 0 0  PHQ- 9 Score 0 - - -    No flowsheet data found.     Assessment & Plan:    Annual Physical  Reviewed patient's Family Medical History Reviewed and updated list of patient's medical providers Assessment of cognitive impairment was done Assessed patient's functional ability Established a written schedule for health screening [place] Completed and Reviewed  Exercise Activities and Dietary recommendations Goals   None     Immunization History  Administered Date(s) Administered  . Pneumococcal Conjugate-13 02/17/2018  . Tdap 02/17/2018    Health Maintenance  Topic Date Due  . INFLUENZA VACCINE  11/15/2018  . PNA vac Low Risk Adult (2 of 2 - PPSV23) 02/18/2019  . MAMMOGRAM  10/23/2020  . COLONOSCOPY  01/28/2023  . TETANUS/TDAP  02/18/2028  . DEXA SCAN  Completed  . Hepatitis C Screening  Completed     Discussed health benefits of physical activity, and encouraged her to engage in regular exercise appropriate for her age and condition.    1. Annual physical exam Normal physical exam today. Will check labs as below and f/u pending lab results. If labs are stable and WNL she will not need to have these rechecked for one year at her next annual physical exam. She is to call the office in the meantime if she has any acute issue, questions or concerns. - CBC w/Diff/Platelet - Comprehensive Metabolic Panel (CMET) - TSH - Lipid Profile - HgB A1c  2. Essential hypertension Stable. Diagnosis pulled for medication refill. Continue current medical treatment plan. Will check labs as below and f/u pending results. - losartan (COZAAR) 100 MG tablet; Take 1 tablet (100 mg total) by mouth daily.  Dispense: 90 tablet; Refill: 3 - triamterene-hydrochlorothiazide (DYAZIDE) 37.5-25 MG capsule; Take 1 each (1 capsule total) by mouth daily.  Dispense: 90 capsule; Refill: 3 - CBC w/Diff/Platelet - Comprehensive Metabolic Panel (CMET) - Lipid Profile - HgB A1c  3. Class 2 severe obesity due to excess calories with serious comorbidity and body mass index (BMI) of  35.0 to 35.9 in adult [facility]) Counseled patient on healthy lifestyle modifications including dieting and exercise.  - CBC w/Diff/Platelet - Comprehensive Metabolic Panel (CMET) - Lipid Profile - HgB A1c  4. Acute anxiety Stable. Diagnosis pulled for medication refill. Continue current medical treatment plan. - ALPRAZolam (XANAX) 0.25 MG tablet; Take 1 tablet (0.25 mg total) by mouth at bedtime as needed.  Dispense: 30 tablet; Refill: 5 - TSH  ------------------------------------------------------------------------------------------------------------    Mar Daring, PA-C  [facility]

## 2019-02-26 NOTE — Patient Instructions (Signed)

## 2019-02-27 ENCOUNTER — Other Ambulatory Visit
Admission: RE | Admit: 2019-02-27 | Discharge: 2019-02-27 | Disposition: A | Discharge status: Home / Self Care | Payer: 59 | Source: Ambulatory Visit | Attending: Ophthalmology | Admitting: Ophthalmology

## 2019-02-27 ENCOUNTER — Telehealth: Payer: Self-pay

## 2019-02-27 ENCOUNTER — Other Ambulatory Visit: Payer: Self-pay

## 2019-02-27 DIAGNOSIS — Z20828 Contact with and (suspected) exposure to other viral communicable diseases: Secondary | ICD-10-CM | POA: Insufficient documentation

## 2019-02-27 DIAGNOSIS — Z01812 Encounter for preprocedural laboratory examination: Secondary | ICD-10-CM | POA: Insufficient documentation

## 2019-02-27 LAB — COMPREHENSIVE METABOLIC PANEL
ALT: 22 IU/L (ref 0–32)
AST: 22 IU/L (ref 0–40)
Albumin/Globulin Ratio: 1.6 (ref 1.2–2.2)
Albumin: 4.2 g/dL (ref 3.8–4.8)
Alkaline Phosphatase: 123 IU/L — ABNORMAL HIGH (ref 39–117)
BUN/Creatinine Ratio: 48 — ABNORMAL HIGH (ref 12–28)
BUN: 31 mg/dL — ABNORMAL HIGH (ref 8–27)
Bilirubin Total: <0.2 mg/dL (ref 0.0–1.2)
CO2: 23 mmol/L (ref 20–29)
Calcium: 10 mg/dL (ref 8.7–10.3)
Chloride: 101 mmol/L (ref 96–106)
Creatinine, Ser: 0.65 mg/dL (ref 0.57–1.00)
GFR calc Af Amer: 107 mL/min/{1.73_m2} (ref >59)
GFR calc non Af Amer: 93 mL/min/{1.73_m2} (ref >59)
Globulin, Total: 2.6 g/dL (ref 1.5–4.5)
Glucose: 92 mg/dL (ref 65–99)
Potassium: 3.9 mmol/L (ref 3.5–5.2)
Sodium: 139 mmol/L (ref 134–144)
Total Protein: 6.8 g/dL (ref 6.0–8.5)

## 2019-02-27 LAB — LIPID PANEL
Chol/HDL Ratio: 3.3 ratio (ref 0.0–4.4)
Cholesterol, Total: 183 mg/dL (ref 100–199)
HDL: 55 mg/dL (ref >39)
LDL Chol Calc (NIH): 106 mg/dL — ABNORMAL HIGH (ref 0–99)
Triglycerides: 121 mg/dL (ref 0–149)
VLDL Cholesterol Cal: 22 mg/dL (ref 5–40)

## 2019-02-27 LAB — TSH: TSH: 2.35 u[IU]/mL (ref 0.450–4.500)

## 2019-02-27 LAB — CBC WITH DIFFERENTIAL/PLATELET
Basophils Absolute: 0 10*3/uL (ref 0.0–0.2)
Basos: 1 %
EOS (ABSOLUTE): 0.1 10*3/uL (ref 0.0–0.4)
Eos: 1 %
Hematocrit: 41.4 % (ref 34.0–46.6)
Hemoglobin: 13.2 g/dL (ref 11.1–15.9)
Immature Grans (Abs): 0 10*3/uL (ref 0.0–0.1)
Immature Granulocytes: 0 %
Lymphocytes Absolute: 2 10*3/uL (ref 0.7–3.1)
Lymphs: 32 %
MCH: 31.5 pg (ref 26.6–33.0)
MCHC: 31.9 g/dL (ref 31.5–35.7)
MCV: 99 fL — ABNORMAL HIGH (ref 79–97)
Monocytes Absolute: 0.5 10*3/uL (ref 0.1–0.9)
Monocytes: 7 %
Neutrophils Absolute: 3.7 10*3/uL (ref 1.4–7.0)
Neutrophils: 59 %
Platelets: 328 10*3/uL (ref 150–450)
RBC: 4.19 x10E6/uL (ref 3.77–5.28)
RDW: 13 % (ref 11.7–15.4)
WBC: 6.3 10*3/uL (ref 3.4–10.8)

## 2019-02-27 LAB — HEMOGLOBIN A1C
Est. average glucose Bld gHb Est-mCnc: 120 mg/dL
Hgb A1c MFr Bld: 5.8 % — ABNORMAL HIGH (ref 4.8–5.6)

## 2019-02-27 NOTE — Telephone Encounter (Signed)
Patient advised as directed below. 

## 2019-02-27 NOTE — Telephone Encounter (Signed)
-----   Message from Mar Daring, [place] sent at 02/27/2019  8:24 AM EST ----- Blood count is normal. Kidney and liver function are normal. Sodium, potassium and calcium are normal. Thyroid is normal. Cholesterol is much improved compared to the last 4 years and now essentially normal ranges. Continue dietary and lifestyle changes. A1c is borderline high at 5.8. Again continue healthy lifestyle modifications and limit sugars.

## 2019-02-28 LAB — SARS CORONAVIRUS 2 (TAT 6-24 HRS): SARS Coronavirus 2: NEGATIVE

## 2019-03-02 NOTE — Discharge Instructions (Signed)

## 2019-03-04 ENCOUNTER — Ambulatory Visit
Admission: RE | Admit: 2019-03-04 | Discharge: 2019-03-04 | Disposition: A | Discharge status: Home / Self Care | Payer: 59 | Attending: Ophthalmology | Admitting: Ophthalmology

## 2019-03-04 ENCOUNTER — Encounter
Admission: RE | Disposition: A | Discharge status: Home / Self Care | Payer: Self-pay | Source: Home / Self Care | Attending: Ophthalmology

## 2019-03-04 ENCOUNTER — Ambulatory Visit: Payer: 59 | Admitting: Anesthesiology

## 2019-03-04 ENCOUNTER — Other Ambulatory Visit: Payer: Self-pay

## 2019-03-04 DIAGNOSIS — K219 Gastro-esophageal reflux disease without esophagitis: Secondary | ICD-10-CM | POA: Insufficient documentation

## 2019-03-04 DIAGNOSIS — I10 Essential (primary) hypertension: Secondary | ICD-10-CM | POA: Insufficient documentation

## 2019-03-04 DIAGNOSIS — H2511 Age-related nuclear cataract, right eye: Secondary | ICD-10-CM | POA: Insufficient documentation

## 2019-03-04 DIAGNOSIS — H25811 Combined forms of age-related cataract, right eye: Secondary | ICD-10-CM | POA: Diagnosis not present

## 2019-03-04 HISTORY — DX: Presence of dental prosthetic device (complete) (partial): Z97.2

## 2019-03-04 HISTORY — PX: CATARACT EXTRACTION W/PHACO: SHX586

## 2019-03-04 HISTORY — DX: Dizziness and giddiness: R42

## 2019-03-04 SURGERY — PHACOEMULSIFICATION, CATARACT, WITH IOL INSERTION
Anesthesia: Monitor Anesthesia Care | Site: Eye | Laterality: Right

## 2019-03-04 MED ORDER — MOXIFLOXACIN HCL 0.5 % OP SOLN
OPHTHALMIC | Status: DC | PRN
  Administered 2019-03-04: 0.2 mL via OPHTHALMIC

## 2019-03-04 MED ORDER — NA HYALUR & NA CHOND-NA HYALUR 0.4-0.35 ML IO KIT
PACK | INTRAOCULAR | Status: DC | PRN
  Administered 2019-03-04: 1 mL via INTRAOCULAR

## 2019-03-04 MED ORDER — EPINEPHRINE PF 1 MG/ML IJ SOLN
INTRAOCULAR | Status: DC | PRN
  Administered 2019-03-04: 60 mL via OPHTHALMIC

## 2019-03-04 MED ORDER — LIDOCAINE HCL (PF) 2 % IJ SOLN
INTRAOCULAR | Status: DC | PRN
  Administered 2019-03-04: 1 mL

## 2019-03-04 MED ORDER — ACETAMINOPHEN 325 MG PO TABS: 325.0000 mg | ORAL_TABLET | ORAL | Status: DC | PRN

## 2019-03-04 MED ORDER — TETRACAINE HCL 0.5 % OP SOLN
1.0000 [drp] | OPHTHALMIC | Status: DC | PRN
  Administered 2019-03-04 (×3): 1 [drp] via OPHTHALMIC

## 2019-03-04 MED ORDER — ACETAMINOPHEN 160 MG/5ML PO SOLN: 325.0000 mg | ORAL | Status: DC | PRN

## 2019-03-04 MED ORDER — FENTANYL CITRATE (PF) 100 MCG/2ML IJ SOLN
INTRAMUSCULAR | Status: DC | PRN
  Administered 2019-03-04 (×2): 50 ug via INTRAVENOUS

## 2019-03-04 MED ORDER — ARMC OPHTHALMIC DILATING DROPS
1.0000 "application " | OPHTHALMIC | Status: DC | PRN
  Administered 2019-03-04 (×3): 1 via OPHTHALMIC

## 2019-03-04 MED ORDER — MIDAZOLAM HCL 2 MG/2ML IJ SOLN
INTRAMUSCULAR | Status: DC | PRN
  Administered 2019-03-04: 2 mg via INTRAVENOUS

## 2019-03-04 MED ORDER — BRIMONIDINE TARTRATE-TIMOLOL 0.2-0.5 % OP SOLN
OPHTHALMIC | Status: DC | PRN
  Administered 2019-03-04: 1 [drp] via OPHTHALMIC

## 2019-03-04 MED ORDER — MOXIFLOXACIN HCL 0.5 % OP SOLN
1.0000 [drp] | OPHTHALMIC | Status: DC | PRN
  Administered 2019-03-04 (×3): 1 [drp] via OPHTHALMIC

## 2019-03-04 SURGICAL SUPPLY — 17 items
CANNULA ANT/CHMB 27G (MISCELLANEOUS) ×1 IMPLANT
CANNULA ANT/CHMB 27GA (MISCELLANEOUS) ×3 IMPLANT
GLOVE SURG LX 7.5 STRW (GLOVE) ×2
GLOVE SURG LX STRL 7.5 STRW (GLOVE) ×1 IMPLANT
GLOVE SURG TRIUMPH 8.0 PF LTX (GLOVE) ×3 IMPLANT
GOWN STRL REUS W/ TWL LRG LVL3 (GOWN DISPOSABLE) ×2 IMPLANT
GOWN STRL REUS W/TWL LRG LVL3 (GOWN DISPOSABLE) ×4
LENS IOL ACRSF IQ PC 27.5 (Intraocular Lens) IMPLANT
LENS IOL ACRYSOF IQ POST 27.5 (Intraocular Lens) ×3 IMPLANT
MARKER SKIN DUAL TIP RULER LAB (MISCELLANEOUS) ×3 IMPLANT
PACK CATARACT BRASINGTON (MISCELLANEOUS) ×3 IMPLANT
PACK EYE AFTER SURG (MISCELLANEOUS) ×3 IMPLANT
PACK OPTHALMIC (MISCELLANEOUS) ×3 IMPLANT
SYR 3ML LL SCALE MARK (SYRINGE) ×3 IMPLANT
SYR TB 1ML LUER SLIP (SYRINGE) ×3 IMPLANT
WATER STERILE IRR 500ML POUR (IV SOLUTION) ×3 IMPLANT
WIPE NON LINTING 3.25X3.25 (MISCELLANEOUS) ×3 IMPLANT

## 2019-03-04 NOTE — Anesthesia Postprocedure Evaluation (Signed)
Anesthesia Post Note  Patient: Christina Conway  Procedure(s) Performed: CATARACT EXTRACTION PHACO AND INTRAOCULAR LENS PLACEMENT (IOC) RIGHT  3.88  00:33.8  11.4% (Right Eye)     Patient location during evaluation: PACU Anesthesia Type: MAC Level of consciousness: awake and alert Pain management: pain level controlled Vital Signs Assessment: post-procedure vital signs reviewed and stable Respiratory status: spontaneous breathing, nonlabored ventilation, respiratory function stable and patient connected to nasal cannula oxygen Cardiovascular status: stable and blood pressure returned to baseline Postop Assessment: no apparent nausea or vomiting Anesthetic complications: no    Trecia Rogers

## 2019-03-04 NOTE — Transfer of Care (Signed)
Immediate Anesthesia Transfer of Care Note  Patient: Christina Conway  Procedure(s) Performed: CATARACT EXTRACTION PHACO AND INTRAOCULAR LENS PLACEMENT (IOC) RIGHT (Right Eye)  Patient Location: PACU  Anesthesia Type: MAC  Level of Consciousness: awake, alert  and patient cooperative  Airway and Oxygen Therapy: Patient Spontanous Breathing and Patient connected to supplemental oxygen  Post-op Assessment: Post-op Vital signs reviewed, Patient's Cardiovascular Status Stable, Respiratory Function Stable, Patent Airway and No signs of Nausea or vomiting  Post-op Vital Signs: Reviewed and stable  Complications: No apparent anesthesia complications

## 2019-03-04 NOTE — Anesthesia Preprocedure Evaluation (Signed)
Anesthesia Evaluation  Patient identified by MRN, date of birth, ID band Patient awake    Reviewed: Allergy & Precautions, H&P , NPO status , Patient's Chart, lab work & pertinent test results, reviewed documented beta blocker date and time   Airway Mallampati: II  TM Distance: >3 FB Neck ROM: full    Dental no notable dental hx.    Pulmonary neg pulmonary ROS,    Pulmonary exam normal breath sounds clear to auscultation       Cardiovascular Exercise Tolerance: Good hypertension, Normal cardiovascular exam Rhythm:regular Rate:Normal     Neuro/Psych negative neurological ROS  negative psych ROS   GI/Hepatic Neg liver ROS, GERD  ,  Endo/Other  negative endocrine ROS  Renal/GU negative Renal ROS  negative genitourinary   Musculoskeletal   Abdominal   Peds  Hematology negative hematology ROS (+)   Anesthesia Other Findings   Reproductive/Obstetrics negative OB ROS                             Anesthesia Physical Anesthesia Plan  ASA: II  Anesthesia Plan: MAC   Post-op Pain Management:    Induction:   PONV Risk Score and Plan:   Airway Management Planned:   Additional Equipment:   Intra-op Plan:   Post-operative Plan:   Informed Consent: I have reviewed the patients History and Physical, chart, labs and discussed the procedure including the risks, benefits and alternatives for the proposed anesthesia with the patient or authorized representative who has indicated his/her understanding and acceptance.     Dental Advisory Given  Plan Discussed with: CRNA  Anesthesia Plan Comments:         Anesthesia Quick Evaluation

## 2019-03-04 NOTE — H&P (Signed)

## 2019-03-04 NOTE — Op Note (Signed)
LOCATION:  [place]   PREOPERATIVE DIAGNOSIS:    Nuclear sclerotic cataract right eye. H25.11   POSTOPERATIVE DIAGNOSIS:  Nuclear sclerotic cataract right eye.     PROCEDURE:  Phacoemusification with posterior chamber intraocular lens placement of the right eye   ULTRASOUND TIME: Procedure(s): CATARACT EXTRACTION PHACO AND INTRAOCULAR LENS PLACEMENT (IOC) RIGHT  3.88  00:33.8  11.4% (Right)  LENS:   Implant Name Type Inv. Item Serial No. Manufacturer Lot No. LRB No. Used Action  LENS IOL ACRYLATE DIOP 27.5 - [id number] Intraocular Lens LENS IOL ACRYLATE DIOP 27.5 [id number] ALCON  Right 1 Implanted         SURGEON:  Wyonia Hough, MD   ANESTHESIA:  Topical with tetracaine drops and 2% Xylocaine jelly, augmented with 1% preservative-free intracameral lidocaine.    COMPLICATIONS:  None.   DESCRIPTION OF PROCEDURE:  The patient was identified in the holding room and transported to the operating room and placed in the supine position under the operating microscope.  The right eye was identified as the operative eye and it was prepped and draped in the usual sterile ophthalmic fashion.   A 1 millimeter clear-corneal paracentesis was made at the 12:00 position.  0.5 ml of preservative-free 1% lidocaine was injected into the anterior chamber. The anterior chamber was filled with Viscoat viscoelastic.  A 2.4 millimeter keratome was used to make a near-clear corneal incision at the 9:00 position.  A curvilinear capsulorrhexis was made with a cystotome and capsulorrhexis forceps.  Balanced salt solution was used to hydrodissect and hydrodelineate the nucleus.   Phacoemulsification was then used in stop and chop fashion to remove the lens nucleus and epinucleus.  The remaining cortex was then removed using the irrigation and aspiration handpiece. Provisc was then placed into the capsular bag to distend it for lens placement.  A lens was then injected into the capsular bag.   The remaining viscoelastic was aspirated.   Wounds were hydrated with balanced salt solution.  The anterior chamber was inflated to a physiologic pressure with balanced salt solution.  No wound leaks were noted. Vigamox 0.2 ml of a 1mg  per ml solution was injected into the anterior chamber for a dose of 0.2 mg of intracameral antibiotic at the completion of the case.   Timolol and Brimonidine drops were applied to the eye.  The patient was taken to the recovery room in stable condition without complications of anesthesia or surgery.   Lucindy Borel 03/04/2019, 11:46 AM

## 2019-03-04 NOTE — Anesthesia Procedure Notes (Signed)
Procedure Name: MAC Date/Time: 03/04/2019 11:23 AM Performed by: Jeannene Patella, CRNA Pre-anesthesia Checklist: Patient identified, Emergency Drugs available, Suction available, Patient being monitored and Timeout performed Patient Re-evaluated:Patient Re-evaluated prior to induction Oxygen Delivery Method: Nasal cannula

## 2019-03-05 ENCOUNTER — Encounter: Payer: Self-pay | Admitting: Ophthalmology

## 2019-03-17 DIAGNOSIS — I10 Essential (primary) hypertension: Secondary | ICD-10-CM | POA: Diagnosis not present

## 2019-03-17 DIAGNOSIS — H2512 Age-related nuclear cataract, left eye: Secondary | ICD-10-CM | POA: Diagnosis not present

## 2019-03-26 ENCOUNTER — Other Ambulatory Visit: Payer: Self-pay

## 2019-03-26 ENCOUNTER — Encounter: Payer: Self-pay | Admitting: Ophthalmology

## 2019-03-27 ENCOUNTER — Other Ambulatory Visit
Admission: RE | Admit: 2019-03-27 | Discharge: 2019-03-27 | Disposition: A | Discharge status: Home / Self Care | Payer: 59 | Source: Ambulatory Visit | Attending: Ophthalmology | Admitting: Ophthalmology

## 2019-03-27 DIAGNOSIS — Z01812 Encounter for preprocedural laboratory examination: Secondary | ICD-10-CM | POA: Insufficient documentation

## 2019-03-27 DIAGNOSIS — Z20828 Contact with and (suspected) exposure to other viral communicable diseases: Secondary | ICD-10-CM | POA: Insufficient documentation

## 2019-03-28 LAB — SARS CORONAVIRUS 2 (TAT 6-24 HRS): SARS Coronavirus 2: NEGATIVE

## 2019-03-29 NOTE — Anesthesia Preprocedure Evaluation (Addendum)
Anesthesia Evaluation  Patient identified by MRN, date of birth, ID band Patient awake    Reviewed: Allergy & Precautions, NPO status , Patient's Chart, lab work & pertinent test results  History of Anesthesia Complications Negative for: history of anesthetic complications  Airway Mallampati: I   Neck ROM: Full    Dental no notable dental hx.    Pulmonary neg pulmonary ROS,    Pulmonary exam normal breath sounds clear to auscultation       Cardiovascular hypertension, Normal cardiovascular exam Rhythm:Regular Rate:Normal     Neuro/Psych PSYCHIATRIC DISORDERS Anxiety Vertigo     GI/Hepatic GERD  ,  Endo/Other  Obesity   Renal/GU negative Renal ROS     Musculoskeletal  (+) Arthritis ,   Abdominal   Peds  Hematology negative hematology ROS (+)   Anesthesia Other Findings   Reproductive/Obstetrics                            Anesthesia Physical Anesthesia Plan  ASA: II  Anesthesia Plan: MAC   Post-op Pain Management:    Induction: Intravenous  PONV Risk Score and Plan: 2 and TIVA, Midazolam and Treatment may vary due to age or medical condition  Airway Management Planned: Natural Airway  Additional Equipment:   Intra-op Plan:   Post-operative Plan:   Informed Consent: I have reviewed the patients History and Physical, chart, labs and discussed the procedure including the risks, benefits and alternatives for the proposed anesthesia with the patient or authorized representative who has indicated his/her understanding and acceptance.       Plan Discussed with: CRNA  Anesthesia Plan Comments:        Anesthesia Quick Evaluation

## 2019-03-31 NOTE — Discharge Instructions (Signed)

## 2019-04-01 ENCOUNTER — Ambulatory Visit: Payer: 59 | Admitting: Anesthesiology

## 2019-04-01 ENCOUNTER — Encounter
Admission: RE | Disposition: A | Discharge status: Home / Self Care | Payer: Self-pay | Source: Home / Self Care | Attending: Ophthalmology

## 2019-04-01 ENCOUNTER — Ambulatory Visit
Admission: RE | Admit: 2019-04-01 | Discharge: 2019-04-01 | Disposition: A | Discharge status: Home / Self Care | Payer: 59 | Attending: Ophthalmology | Admitting: Ophthalmology

## 2019-04-01 ENCOUNTER — Encounter: Payer: Self-pay | Admitting: Ophthalmology

## 2019-04-01 ENCOUNTER — Other Ambulatory Visit: Payer: Self-pay

## 2019-04-01 DIAGNOSIS — H25812 Combined forms of age-related cataract, left eye: Secondary | ICD-10-CM | POA: Diagnosis not present

## 2019-04-01 DIAGNOSIS — Z6834 Body mass index (BMI) 34.0-34.9, adult: Secondary | ICD-10-CM | POA: Diagnosis not present

## 2019-04-01 DIAGNOSIS — E669 Obesity, unspecified: Secondary | ICD-10-CM | POA: Diagnosis not present

## 2019-04-01 DIAGNOSIS — F419 Anxiety disorder, unspecified: Secondary | ICD-10-CM | POA: Insufficient documentation

## 2019-04-01 DIAGNOSIS — R69 Illness, unspecified: Secondary | ICD-10-CM | POA: Diagnosis not present

## 2019-04-01 DIAGNOSIS — K219 Gastro-esophageal reflux disease without esophagitis: Secondary | ICD-10-CM | POA: Diagnosis not present

## 2019-04-01 DIAGNOSIS — H2512 Age-related nuclear cataract, left eye: Secondary | ICD-10-CM | POA: Insufficient documentation

## 2019-04-01 DIAGNOSIS — M1990 Unspecified osteoarthritis, unspecified site: Secondary | ICD-10-CM | POA: Insufficient documentation

## 2019-04-01 DIAGNOSIS — I10 Essential (primary) hypertension: Secondary | ICD-10-CM | POA: Insufficient documentation

## 2019-04-01 HISTORY — DX: Diverticulosis of intestine, part unspecified, without perforation or abscess without bleeding: K57.90

## 2019-04-01 HISTORY — PX: CATARACT EXTRACTION W/PHACO: SHX586

## 2019-04-01 SURGERY — PHACOEMULSIFICATION, CATARACT, WITH IOL INSERTION
Anesthesia: Monitor Anesthesia Care | Site: Eye | Laterality: Left

## 2019-04-01 MED ORDER — LIDOCAINE HCL (PF) 2 % IJ SOLN
INTRAOCULAR | Status: DC | PRN
  Administered 2019-04-01: 1 mL

## 2019-04-01 MED ORDER — BRIMONIDINE TARTRATE-TIMOLOL 0.2-0.5 % OP SOLN
OPHTHALMIC | Status: DC | PRN
  Administered 2019-04-01: 1 [drp] via OPHTHALMIC

## 2019-04-01 MED ORDER — ACETAMINOPHEN 325 MG PO TABS: 650.0000 mg | ORAL_TABLET | Freq: Once | ORAL | Status: DC | PRN

## 2019-04-01 MED ORDER — ONDANSETRON HCL 4 MG/2ML IJ SOLN: 4.0000 mg | Freq: Once | INTRAMUSCULAR | Status: DC | PRN

## 2019-04-01 MED ORDER — FENTANYL CITRATE (PF) 100 MCG/2ML IJ SOLN
INTRAMUSCULAR | Status: DC | PRN
  Administered 2019-04-01 (×2): 50 ug via INTRAVENOUS

## 2019-04-01 MED ORDER — MOXIFLOXACIN HCL 0.5 % OP SOLN
OPHTHALMIC | Status: DC | PRN
  Administered 2019-04-01: 0.2 mL via OPHTHALMIC

## 2019-04-01 MED ORDER — EPINEPHRINE PF 1 MG/ML IJ SOLN
INTRAOCULAR | Status: DC | PRN
  Administered 2019-04-01: 10:00:00 42 mL via OPHTHALMIC

## 2019-04-01 MED ORDER — MIDAZOLAM HCL 2 MG/2ML IJ SOLN
INTRAMUSCULAR | Status: DC | PRN
  Administered 2019-04-01 (×2): 1 mg via INTRAVENOUS

## 2019-04-01 MED ORDER — NA HYALUR & NA CHOND-NA HYALUR 0.4-0.35 ML IO KIT
PACK | INTRAOCULAR | Status: DC | PRN
  Administered 2019-04-01: 1 mL via INTRAOCULAR

## 2019-04-01 MED ORDER — MOXIFLOXACIN HCL 0.5 % OP SOLN
1.0000 [drp] | OPHTHALMIC | Status: DC | PRN
  Administered 2019-04-01 (×3): 1 [drp] via OPHTHALMIC

## 2019-04-01 MED ORDER — ACETAMINOPHEN 160 MG/5ML PO SOLN: 325.0000 mg | ORAL | Status: DC | PRN

## 2019-04-01 MED ORDER — TETRACAINE HCL 0.5 % OP SOLN
1.0000 [drp] | OPHTHALMIC | Status: DC | PRN
  Administered 2019-04-01 (×3): 1 [drp] via OPHTHALMIC

## 2019-04-01 MED ORDER — ARMC OPHTHALMIC DILATING DROPS
1.0000 "application " | OPHTHALMIC | Status: DC | PRN
  Administered 2019-04-01 (×3): 1 via OPHTHALMIC

## 2019-04-01 SURGICAL SUPPLY — 18 items
CANNULA ANT/CHMB 27G (MISCELLANEOUS) ×1 IMPLANT
CANNULA ANT/CHMB 27GA (MISCELLANEOUS) ×3 IMPLANT
GLOVE SURG LX 7.5 STRW (GLOVE) ×2
GLOVE SURG LX STRL 7.5 STRW (GLOVE) ×1 IMPLANT
GLOVE SURG TRIUMPH 8.0 PF LTX (GLOVE) ×3 IMPLANT
GOWN STRL REUS W/ TWL LRG LVL3 (GOWN DISPOSABLE) ×2 IMPLANT
GOWN STRL REUS W/TWL LRG LVL3 (GOWN DISPOSABLE) ×4
LENS IOL ACRSF IQ TRC 3 24.0 IMPLANT
LENS IOL ACRYSOF IQ TORIC 24.0 ×2 IMPLANT
LENS IOL IQ TORIC 3 24.0 ×1 IMPLANT
MARKER SKIN DUAL TIP RULER LAB (MISCELLANEOUS) ×3 IMPLANT
PACK CATARACT BRASINGTON (MISCELLANEOUS) ×3 IMPLANT
PACK EYE AFTER SURG (MISCELLANEOUS) ×3 IMPLANT
PACK OPTHALMIC (MISCELLANEOUS) ×3 IMPLANT
SYR 3ML LL SCALE MARK (SYRINGE) ×3 IMPLANT
SYR TB 1ML LUER SLIP (SYRINGE) ×3 IMPLANT
WATER STERILE IRR 500ML POUR (IV SOLUTION) ×3 IMPLANT
WIPE NON LINTING 3.25X3.25 (MISCELLANEOUS) ×3 IMPLANT

## 2019-04-01 NOTE — Anesthesia Procedure Notes (Signed)
Procedure Name: MAC Performed by: Cha Gomillion, CRNA Pre-anesthesia Checklist: Patient identified, Emergency Drugs available, Suction available, Timeout performed and Patient being monitored Patient Re-evaluated:Patient Re-evaluated prior to induction Oxygen Delivery Method: Nasal cannula Placement Confirmation: positive ETCO2       

## 2019-04-01 NOTE — Anesthesia Postprocedure Evaluation (Signed)
Anesthesia Post Note  Patient: Christina Conway  Procedure(s) Performed: CATARACT EXTRACTION PHACO AND INTRAOCULAR LENS PLACEMENT (IOC) LEFT  3.59  00:28.2  12.7% (Left Eye)     Patient location during evaluation: PACU Anesthesia Type: MAC Level of consciousness: awake and alert, oriented and patient cooperative Pain management: pain level controlled Vital Signs Assessment: post-procedure vital signs reviewed and stable Respiratory status: spontaneous breathing, nonlabored ventilation and respiratory function stable Cardiovascular status: blood pressure returned to baseline and stable Postop Assessment: adequate PO intake Anesthetic complications: no    Darrin Nipper

## 2019-04-01 NOTE — Op Note (Signed)
LOCATION:  [place]   PREOPERATIVE DIAGNOSIS:  Nuclear sclerotic cataract of the left eye.  H25.12  POSTOPERATIVE DIAGNOSIS:  Nuclear sclerotic cataract of the left eye.   PROCEDURE:  Phacoemulsification with Toric posterior chamber intraocular lens placement of the left eye.  Ultrasound time: Procedure(s): CATARACT EXTRACTION PHACO AND INTRAOCULAR LENS PLACEMENT (IOC) LEFT  3.59  00:28.2  12.7% (Left)  LENS:[id number] 24.0 D Toric intraocular lens with 1.5 diopters of cylindrical power with axis orientation at 89 degrees.    SURGEON:  Wyonia Hough, MD   ANESTHESIA:  Topical with tetracaine drops and 2% Xylocaine jelly, augmented with 1% preservative-free intracameral lidocaine.  COMPLICATIONS:  None.   DESCRIPTION OF PROCEDURE:  The patient was identified in the holding room and transported to the operating suite and placed in the supine position under the operating microscope.  The left eye was identified as the operative eye, and it was prepped and draped in the usual sterile ophthalmic fashion.    A clear-corneal paracentesis incision was made at the 1:30 position.  0.5 ml of preservative-free 1% lidocaine was injected into the anterior chamber. The anterior chamber was filled with Viscoat.  A 2.4 millimeter near clear corneal incision was then made at the 10:30 position.  A cystotome and capsulorrhexis forceps were then used to make a curvilinear capsulorrhexis.  Hydrodissection and hydrodelineation were then performed using balanced salt solution.   Phacoemulsification was then used in stop and chop fashion to remove the lens, nucleus and epinucleus.  The remaining cortex was aspirated using the irrigation and aspiration handpiece.  Provisc viscoelastic was then placed into the capsular bag to distend it for lens placement.  The Verion digital marker was used to align the implant at the intended axis.   A 24.0 diopter lens was then injected into the capsular bag.   It was rotated clockwise until the axis marks on the lens were approximately 15 degrees in the counterclockwise direction to the intended alignment.  The viscoelastic was aspirated from the eye using the irrigation aspiration handpiece.  Then, a Koch spatula through the sideport incision was used to rotate the lens in a clockwise direction until the axis markings of the intraocular lens were lined up with the Verion alignment.  Balanced salt solution was then used to hydrate the wounds. Vigamox 0.2 ml of a 1mg  per ml solution was injected into the anterior chamber for a dose of 0.2 mg of intracameral antibiotic at the completion of the case.    The eye was noted to have a physiologic pressure and there was no wound leak noted.   Timolol and Brimonidine drops were applied to the eye.  The patient was taken to the recovery room in stable condition having had no complications of anesthesia or surgery.  Alishea Beaudin 04/01/2019, 9:51 AM

## 2019-04-01 NOTE — Transfer of Care (Signed)
Immediate Anesthesia Transfer of Care Note  Patient: Christina Conway  Procedure(s) Performed: CATARACT EXTRACTION PHACO AND INTRAOCULAR LENS PLACEMENT (IOC) LEFT  3.59  00:28.2  12.7% (Left Eye)  Patient Location: PACU  Anesthesia Type: MAC  Level of Consciousness: awake, alert  and patient cooperative  Airway and Oxygen Therapy: Patient Spontanous Breathing and Patient connected to supplemental oxygen  Post-op Assessment: Post-op Vital signs reviewed, Patient's Cardiovascular Status Stable, Respiratory Function Stable, Patent Airway and No signs of Nausea or vomiting  Post-op Vital Signs: Reviewed and stable  Complications: No apparent anesthesia complications

## 2019-04-01 NOTE — H&P (Signed)

## 2019-04-02 ENCOUNTER — Encounter: Payer: Self-pay | Admitting: *Deleted

## 2019-05-25 DIAGNOSIS — M7542 Impingement syndrome of left shoulder: Secondary | ICD-10-CM | POA: Diagnosis not present

## 2019-05-28 DIAGNOSIS — L400 Psoriasis vulgaris: Secondary | ICD-10-CM | POA: Diagnosis not present

## 2019-05-28 DIAGNOSIS — D225 Melanocytic nevi of trunk: Secondary | ICD-10-CM | POA: Diagnosis not present

## 2019-05-28 DIAGNOSIS — D2262 Melanocytic nevi of left upper limb, including shoulder: Secondary | ICD-10-CM | POA: Diagnosis not present

## 2019-05-28 DIAGNOSIS — L814 Other melanin hyperpigmentation: Secondary | ICD-10-CM | POA: Diagnosis not present

## 2019-05-28 DIAGNOSIS — X32XXXA Exposure to sunlight, initial encounter: Secondary | ICD-10-CM | POA: Diagnosis not present

## 2019-07-06 ENCOUNTER — Telehealth: Payer: Self-pay | Admitting: Plastic Surgery

## 2019-07-06 NOTE — Telephone Encounter (Signed)

## 2019-07-07 ENCOUNTER — Other Ambulatory Visit: Payer: Self-pay

## 2019-07-07 ENCOUNTER — Ambulatory Visit: Payer: 59 | Admitting: Plastic Surgery

## 2019-07-07 ENCOUNTER — Encounter: Payer: Self-pay | Admitting: Plastic Surgery

## 2019-07-07 DIAGNOSIS — M25519 Pain in unspecified shoulder: Secondary | ICD-10-CM | POA: Insufficient documentation

## 2019-07-07 DIAGNOSIS — M25511 Pain in right shoulder: Secondary | ICD-10-CM

## 2019-07-07 DIAGNOSIS — G8929 Other chronic pain: Secondary | ICD-10-CM

## 2019-07-07 DIAGNOSIS — M25512 Pain in left shoulder: Secondary | ICD-10-CM

## 2019-07-07 DIAGNOSIS — M549 Dorsalgia, unspecified: Secondary | ICD-10-CM | POA: Insufficient documentation

## 2019-07-07 DIAGNOSIS — N62 Hypertrophy of breast: Secondary | ICD-10-CM | POA: Insufficient documentation

## 2019-07-07 DIAGNOSIS — M546 Pain in thoracic spine: Secondary | ICD-10-CM

## 2019-07-07 NOTE — Progress Notes (Addendum)
Patient ID: Christina Conway, female    DOB: 1951/01/31, 69 y.o.   MRN: [id number]   Chief Complaint  Patient presents with  . Advice Only    BL breast reduction  . Breast Problem    Mammary Hyperplasia: The patient is a 69 y.o. female with a history of mammary hyperplasia for several years.  She has extremely large breasts causing symptoms that include the following: Back pain in the upper and lower back, including neck pain. She pulls or pins her bra straps to provide better lift and relief of the pressure and pain. She notices relief by holding her breast up manually.  Her shoulder straps cause grooves and pain and pressure that requires padding for relief. Pain medication is sometimes required with motrin and tylenol.  Activities that are hindered by enlarged breasts include: exercise and running.   Her breasts are extremely large and fairly symmetric.  She has hyperpigmentation of the inframammary area on both sides.  The sternal to nipple distance on the right is 39 cm and the left is 40 cm.  The IMF distance is 20 cm.  She is 5 feet 4 inches tall and weighs 203 pounds.  Preoperative bra size = 44 DDD cup.  The estimated excess breast tissue to be removed at the time of surgery = 645 grams on the left and 645 grams on the right.  Mammogram history: July 2020 and it was negative she has been to the chiropractor for back and shoulder pain without long-term improvement.  Have a family history of breast cancer.  She had physical therapy years ago that improvement and is willing to go again.  She is not a smoker.  She is accompanied by her husband who is very supportive.    Review of Systems  Constitutional: Positive for activity change. Negative for appetite change.  Eyes: Negative.   Respiratory: Negative.  Negative for chest tightness and shortness of breath.   Cardiovascular: Negative for leg swelling.  Gastrointestinal: Negative for abdominal distention and abdominal pain.  Endocrine:  Negative.   Genitourinary: Negative.   Musculoskeletal: Positive for back pain and neck pain.  Neurological: Negative.   Psychiatric/Behavioral: Negative.     Past Medical History:  Diagnosis Date  . Arthritis    fingers  . Chicken pox   . Dental bridge present    Permanent dental retainer - bottom front  . Diverticulosis   . GERD (gastroesophageal reflux disease)   . Headache(784.0)   . Hyperlipidemia   . Hypertension   . Vertigo    no episodes for over 10 years    Past Surgical History:  Procedure Laterality Date  . ABDOMINAL HYSTERECTOMY  08/02/1997  . bunion    . BUNIONECTOMY  06/03/1998 & 06/04/2003  . CATARACT EXTRACTION W/PHACO Right 03/04/2019   Procedure: CATARACT EXTRACTION PHACO AND INTRAOCULAR LENS PLACEMENT (IOC) RIGHT  3.88  00:33.8  11.4%;  Surgeon: Leandrew Koyanagi, MD;  Location: [facility];  Service: Ophthalmology;  Laterality: Right;  . CATARACT EXTRACTION W/PHACO Left 04/01/2019   Procedure: CATARACT EXTRACTION PHACO AND INTRAOCULAR LENS PLACEMENT (IOC) LEFT  3.59  00:28.2  12.7%;  Surgeon: Leandrew Koyanagi, MD;  Location: [facility];  Service: Ophthalmology;  Laterality: Left;  . HAND SURGERY  09/01/2002   righ hand      Current Outpatient Medications:  .  ALPRAZolam (XANAX) 0.25 MG tablet, Take 1 tablet (0.25 mg total) by mouth at bedtime as needed., Disp: 30 tablet, Rfl:  5 .  losartan (COZAAR) 100 MG tablet, Take 1 tablet (100 mg total) by mouth daily., Disp: 90 tablet, Rfl: 3 .  potassium chloride SA (KLOR-CON M20) 20 MEQ tablet, Take 1 tablet (20 mEq total) by mouth daily., Disp: 90 tablet, Rfl: 3 .  triamterene-hydrochlorothiazide (DYAZIDE) 37.5-25 MG capsule, Take 1 each (1 capsule total) by mouth daily., Disp: 90 capsule, Rfl: 3 .  TURMERIC CURCUMIN PO, Take by mouth daily., Disp: , Rfl:    Objective:   Vitals:   07/07/19 1129  BP: 137/87  Pulse: 77  Temp: (!) 97.3 F (36.3 C)  SpO2: 99%    Physical  Exam Vitals and nursing note reviewed.  Constitutional:      Appearance: Normal appearance.  HENT:     Head: Normocephalic and atraumatic.  Cardiovascular:     Rate and Rhythm: Normal rate.     Pulses: Normal pulses.  Pulmonary:     Effort: Pulmonary effort is normal. No respiratory distress.  Abdominal:     General: Abdomen is flat. There is no distension.  Skin:    General: Skin is warm.     Capillary Refill: Capillary refill takes less than 2 seconds.  Neurological:     General: No focal deficit present.     Mental Status: She is alert and oriented to person, place, and time.  Psychiatric:        Mood and Affect: Mood normal.        Behavior: Behavior normal.     Assessment & Plan:  Symptomatic mammary hypertrophy  Chronic bilateral thoracic back pain  Chronic pain of both shoulders  Recommend bilateral breast reduction with possible liposuction. We will need the last mammogram report results. I have also referred her for physical therapy.  Pictures were obtained of the patient and placed in the chart with the patient's or guardian's permission.  Olivia Mackie was able to find the mammogram report which is confirmed as July.  Wallace Going, DO   The 21st Century Cures Act was signed into law in 2016 which includes the topic of electronic health records.  This provides immediate access to information in MyChart.  This includes consultation notes, operative notes, office notes, lab results and pathology reports.  If you have any questions about what you read please let us know at your next visit or call us at the office.  We are right here with you.

## 2019-07-13 ENCOUNTER — Encounter: Payer: Self-pay | Admitting: Physical Therapy

## 2019-07-13 ENCOUNTER — Other Ambulatory Visit: Payer: Self-pay

## 2019-07-13 ENCOUNTER — Ambulatory Visit: Payer: 59 | Attending: Plastic Surgery | Admitting: Physical Therapy

## 2019-07-13 DIAGNOSIS — G8929 Other chronic pain: Secondary | ICD-10-CM | POA: Diagnosis not present

## 2019-07-13 DIAGNOSIS — M542 Cervicalgia: Secondary | ICD-10-CM | POA: Diagnosis not present

## 2019-07-13 DIAGNOSIS — M25512 Pain in left shoulder: Secondary | ICD-10-CM | POA: Diagnosis not present

## 2019-07-13 NOTE — Therapy (Signed)
[place] [facility] [address] [place], [address] Phone: [phone]   Fax:  [phone]  Physical Therapy Evaluation  Patient Details  Name: Christina Conway MRN: [id number] Date of Birth: May 15, 1950 No data recorded  Encounter Date: 07/13/2019  PT End of Session - 07/13/19 0834    Visit Number  1    Number of Visits  17    Date for PT Re-Evaluation  08/28/19    PT Start Time  0800    PT Stop Time  0845    PT Time Calculation (min)  45 min    Activity Tolerance  Patient tolerated treatment well    Behavior During Therapy  [facility] for tasks assessed/performed       Past Medical History:  Diagnosis Date  . Arthritis    fingers  . Chicken pox   . Dental bridge present    Permanent dental retainer - bottom front  . Diverticulosis   . GERD (gastroesophageal reflux disease)   . Headache(784.0)   . Hyperlipidemia   . Hypertension   . Vertigo    no episodes for over 10 years    Past Surgical History:  Procedure Laterality Date  . ABDOMINAL HYSTERECTOMY  08/02/1997  . bunion    . BUNIONECTOMY  06/03/1998 & 06/04/2003  . CATARACT EXTRACTION W/PHACO Right 03/04/2019   Procedure: CATARACT EXTRACTION PHACO AND INTRAOCULAR LENS PLACEMENT (IOC) RIGHT  3.88  00:33.8  11.4%;  Surgeon: Leandrew Koyanagi, MD;  Location: [place];  Service: Ophthalmology;  Laterality: Right;  . CATARACT EXTRACTION W/PHACO Left 04/01/2019   Procedure: CATARACT EXTRACTION PHACO AND INTRAOCULAR LENS PLACEMENT (IOC) LEFT  3.59  00:28.2  12.7%;  Surgeon: Leandrew Koyanagi, MD;  Location: [place];  Service: Ophthalmology;  Laterality: Left;  . HAND SURGERY  09/01/2002   righ hand    There were no vitals filed for this visit.    OBJECTIVE  Mental Status Patient is oriented to person, place and time.  Recent memory is intact.  Remote memory is intact.  Attention span and concentration are intact.  Expressive  speech is intact.  Patient's fund of knowledge is within normal limits for educational level.  SENSATION: Grossly intact to light touch bilateral UE as determined by testing dermatomes C2-T2 Proprioception and hot/cold testing deferred on this date   MUSCULOSKELETAL: Tremor: None Bulk: Normal Tone: Normal  Posture Upper crossed syndrome, increased thoracic kyphosis  Palpation Increased tension with pain at bilat suboccipitals,bilat UT and upper cervical parapspinals with patient reporting these feel very sore  Strength R/L 5/5 Shoulder flexion (anterior deltoid/pec major/coracobrachialis, axillary n. (C5/6) and musculocutaneous n. (C5-7)) 5/5 Shoulder abduction (deltoid/supraspinatus, axillary/suprascapular n, C5) 5/5 Shoulder external rotation (infraspinatus/teres minor) 5/5 Shoulder internal rotation (subcapularis/lats/pec major) 5/5 Shoulder extension (posterior deltoid, lats, teres major, axillary/thoracodorsal n.) 5/5 Shoulder horizontal abduction 5/5 Elbow flexion (biceps brachii, brachialis, brachioradialis, musculoskeletal n, C5/6) 5/5 Elbow extension (triceps, radial n, C7) 5/5 Wrist Extension (C6/7) 5/5 Wrist Flexion (C6/7) 5/5 Finger adduction (interossei, ulnar n, T1) 3-/3- Y lower trap 4-/4- T Scapular retractors 4/4 I position 4+/4+ Latissimus  Cervical isometrics are strong in all directions;  AROM All cervical motions WNL with "stretch" at opposite UT with lateral bending All shoulder motions WNL  *Indicates pain, overpressure performed unless otherwise indicated  PROM R/L 50 Cervical Flexion 80 Cervical Extension 45/45 Cervical Lateral Flexion 85/85 Cervical Rotation *Indicates pain, overpressure performed unless otherwise indicated  Repeated Movements No centralization or  peripheralization of symptoms with repeated cervical protraction and retraction.    SPECIAL TESTS Spurlings Negative bilat Distraction Test: Positive Hoffman Sign (cervical  cord compression): Negative bilat ULTT Median: Negative bilat ULTT Ulnar:Negative bilat ULTT Radial: Negative bilat  Ther-Ex UT stretch x30sec bilat Levator stretch x30sec bilat Scapular retractions every hour of work x10 3-5sec holds with good carry over of cuing to complete without hiking with good carry over Education on posture and need to strengthen posterior scapular musculature and upper crossed syndrome with good carry over                         Objective measurements completed on examination: See above findings.                PT Short Term Goals - 07/13/19 0835      PT SHORT TERM GOAL #1   Title  Pt will be independent with HEP in order to improve strength and decrease back pain in order to improve pain-free function at home and work.    Baseline  07/13/19 HEP given    Time  8    Period  Weeks    Status  New        PT Long Term Goals - 07/13/19 0836      PT LONG TERM GOAL #1   Title  Patient will increase FOTO score to 68 to demonstrate predicted increase in functional mobility to complete ADLs    Baseline  07/13/19 61    Time  6    Period  Weeks    Status  New      PT LONG TERM GOAL #2   Title  Pt will decrease worst neck pain as reported on NPRS by at least 2 points in order to demonstrate clinically significant reduction in back pain.    Baseline  07/13/19 8/10    Time  6    Period  Weeks    Status  New      PT LONG TERM GOAL #3   Title  Pt will increase periscapular strength of by at least 1/2 MMT grade in order to demonstrate improvement in strength and function.    Baseline  07/13/19 3-/3- Y lower trap; 4-/4- T Scapular retractors; 4/4 I position; 4+/4+ Latissimus    Time  6    Period  Weeks    Status  New             Plan - 07/13/19 1042    Clinical Impression Statement  Patinet is a 69 year old female presenting with bilat shoulder and neck pain secondary to large breasts, and is seeking breast reduction  options. Patient with visable indentions in her shoulder from bra strap dispite having supporting undergarments on, and reports relief with breasts supported on a countertop or pillow. Impairments in postural dyscuntion, periscapular weakness, tension/trigger points in bilat UT and suboccipital musculature, and pain. 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 It is this PT's opinion that PT can aid in postural restoration and pain reduction, but that utilimately patient would benefit from breast reduction to address long term pain and posture goals. Patient will benefit from skilled PT to address aforementioned impairments to improve function.    Personal Factors and Comorbidities  Age;Behavior Pattern;Comorbidity 1;Sex;Fitness;Profession    Comorbidities  HTN    Examination-Activity Limitations  Lift;Carry;Sit;Squat    Examination-Participation Restrictions  Laundry;Cleaning;Community  Activity;Meal Prep    Stability/Clinical Decision Making  Evolving/Moderate complexity    Clinical Decision Making  Moderate    Rehab Potential  Good    PT Frequency  2x / week    PT Duration  8 weeks    PT Treatment/Interventions  ADLs/Self Care Home Management;Electrical Stimulation;Therapeutic activities;Patient/family education;Cryotherapy;Moist Heat;Iontophoresis 4mg /ml Dexamethasone;Functional mobility training;Manual techniques;Dry needling;Neuromuscular re-education;Therapeutic exercise;Passive range of motion;Spinal Manipulations;Joint Manipulations    PT Next Visit Plan  postural restoration    PT Home Exercise Plan  UT stretch, levator stretch, scapular retractions    Consulted and Agree with Plan of Care  Patient       Patient will benefit from skilled therapeutic intervention in order to improve the following deficits and impairments:  Increased fascial restricitons, Improper body mechanics, Pain, Postural  dysfunction, Decreased activity tolerance, Impaired flexibility, Impaired UE functional use, Decreased strength  Visit Diagnosis: Cervicalgia  Chronic left shoulder pain     Problem List Patient Active Problem List   Diagnosis Date Noted  . Symptomatic mammary hypertrophy 07/07/2019  . Back pain 07/07/2019  . Shoulder pain 07/07/2019  . Facial rhytids 03/08/2015  . Obesity (BMI 30-39.9) 11/18/2011  . Hypertension 11/16/2011  . Contact dermatitis 11/16/2011  . Benign colonic polyp 11/16/2011   Shelton Silvas PT, DPT Shelton Silvas 07/13/2019, 11:11 AM  [place] [facility] [address] [place], [address] Phone: [phone]   Fax:  [phone]  Name: Christina Conway MRN: [id number] Date of Birth: 1950-06-18

## 2019-07-15 DIAGNOSIS — M25512 Pain in left shoulder: Secondary | ICD-10-CM | POA: Diagnosis not present

## 2019-07-15 DIAGNOSIS — M7542 Impingement syndrome of left shoulder: Secondary | ICD-10-CM | POA: Diagnosis not present

## 2019-07-15 DIAGNOSIS — M7502 Adhesive capsulitis of left shoulder: Secondary | ICD-10-CM | POA: Diagnosis not present

## 2019-07-17 ENCOUNTER — Encounter: Payer: 59 | Admitting: Physical Therapy

## 2019-07-20 ENCOUNTER — Other Ambulatory Visit: Payer: Self-pay

## 2019-07-20 ENCOUNTER — Encounter: Payer: Self-pay | Admitting: Physical Therapy

## 2019-07-20 ENCOUNTER — Ambulatory Visit: Payer: 59 | Attending: Plastic Surgery | Admitting: Physical Therapy

## 2019-07-20 DIAGNOSIS — G8929 Other chronic pain: Secondary | ICD-10-CM | POA: Diagnosis not present

## 2019-07-20 DIAGNOSIS — M542 Cervicalgia: Secondary | ICD-10-CM | POA: Insufficient documentation

## 2019-07-20 DIAGNOSIS — M25512 Pain in left shoulder: Secondary | ICD-10-CM | POA: Insufficient documentation

## 2019-07-20 NOTE — Therapy (Signed)
[place] [facility] [address] [place], [address] Phone: [phone]   Fax:  [phone]  Physical Therapy Treatment  Patient Details  Name: Christina Conway MRN: [id number] Date of Birth: 1951-01-23 No data recorded  Encounter Date: 07/20/2019  PT End of Session - 07/20/19 0738    Visit Number  2    Number of Visits  17    Date for PT Re-Evaluation  08/28/19    PT Start Time  0734    PT Stop Time  0813    PT Time Calculation (min)  39 min    Activity Tolerance  Patient tolerated treatment well    Behavior During Therapy  [facility] for tasks assessed/performed       Past Medical History:  Diagnosis Date  . Arthritis    fingers  . Chicken pox   . Dental bridge present    Permanent dental retainer - bottom front  . Diverticulosis   . GERD (gastroesophageal reflux disease)   . Headache(784.0)   . Hyperlipidemia   . Hypertension   . Vertigo    no episodes for over 10 years    Past Surgical History:  Procedure Laterality Date  . ABDOMINAL HYSTERECTOMY  08/02/1997  . bunion    . BUNIONECTOMY  06/03/1998 & 06/04/2003  . CATARACT EXTRACTION W/PHACO Right 03/04/2019   Procedure: CATARACT EXTRACTION PHACO AND INTRAOCULAR LENS PLACEMENT (IOC) RIGHT  3.88  00:33.8  11.4%;  Surgeon: Leandrew Koyanagi, MD;  Location: [place];  Service: Ophthalmology;  Laterality: Right;  . CATARACT EXTRACTION W/PHACO Left 04/01/2019   Procedure: CATARACT EXTRACTION PHACO AND INTRAOCULAR LENS PLACEMENT (IOC) LEFT  3.59  00:28.2  12.7%;  Surgeon: Leandrew Koyanagi, MD;  Location: [place];  Service: Ophthalmology;  Laterality: Left;  . HAND SURGERY  09/01/2002   righ hand    There were no vitals filed for this visit.  Subjective Assessment - 07/20/19 0736    Subjective  Patient reports some temporary relief for stretching regimen. Reports 6/10 back pain today, reporting that she just put her bra on and that it  hurts whenever she does so.    Pertinent History  Patient is a 69 year old female presenting with bilat neck pain d/t bra pain. Reports that her breasts have become heavy over the past few years and shows indention of her bra strap that she reports is constant. She has to move her bra strap to her shoulder by mid morining, though she does have very supportive bras. 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 She has a low grade headache with this neck/shoulder pain that is at the base of the skull and feels like a tension headache. Worst neck/shoulder pain 8/10; best 3/10. Reports her pain comes on at random and interferes with her job with typing at her computer, crafting/painting, gardening, doing things with her grandchildren, and comunity activities/errands. In sitting patient reports she is constantly trying to find ways to take pressure from breast. Pt denies N/V, B&B changes, unexplained weight fluctuation, saddle paresthesia, fever, night sweats, or unrelenting night pain at this time.    Limitations  Sitting;Lifting;House hold activities;Standing;Walking    How long can you sit comfortably?  uncomfortable for any extended period of time, adjusts herself and puts pillows under her breasts    How long can you stand comfortably?  constantly adjusting  How long can you walk comfortably?  constantly adjusting    Diagnostic tests  None completed    Patient Stated Goals  Decrease neck and shoulder pain    Pain Onset  More than a month ago           Ther-Ex Nustep L4 37mins seat setting 6 UE setting 6 for gentle strengthening and tspine motion  Seated rows 20# 3x 10 with good carry over following demonstration and cuing for eccentric control with good carry over Lat pull down 20# 3x 10 with good carry over of demo and cuing to prevent shoulder hiking Standing facepulls/high rows 10# 3x 10 with demo and heavy cuing for  posture with decent carry over Seated theraball rollouts x10 10sec hold with good carry over of cuing for full ROM UT stretch x30sec hold bilat with cuing to "anchor" opposite UE Levator stretch x30sec hold bilat with some correction needed for eval from posture Scap retractionsx20 with 3sec holds with TC and demo for set up/posture with good carry over                PT Education - 07/20/19 0737    Education Details  therex form, technique    Person(s) Educated  Patient    Methods  Explanation;Demonstration;Verbal cues    Comprehension  Verbalized understanding;Returned demonstration;Verbal cues required       PT Short Term Goals - 07/13/19 0835      PT SHORT TERM GOAL #1   Title  Pt will be independent with HEP in order to improve strength and decrease back pain in order to improve pain-free function at home and work.    Baseline  07/13/19 HEP given    Time  8    Period  Weeks    Status  New        PT Long Term Goals - 07/13/19 0836      PT LONG TERM GOAL #1   Title  Patient will increase FOTO score to 68 to demonstrate predicted increase in functional mobility to complete ADLs    Baseline  07/13/19 61    Time  6    Period  Weeks    Status  New      PT LONG TERM GOAL #2   Title  Pt will decrease worst neck pain as reported on NPRS by at least 2 points in order to demonstrate clinically significant reduction in back pain.    Baseline  07/13/19 8/10    Time  6    Period  Weeks    Status  New      PT LONG TERM GOAL #3   Title  Pt will increase periscapular strength of by at least 1/2 MMT grade in order to demonstrate improvement in strength and function.    Baseline  07/13/19 3-/3- Y lower trap; 4-/4- T Scapular retractors; 4/4 I position; 4+/4+ Latissimus    Time  6    Period  Weeks    Status  New            Plan - 07/20/19 0759    Clinical Impression Statement  PT initiated therex for postural strengthening with good success. Patient is able to  comply with all cuing for proper technique to achieve proper muscle activation and neutral posture. PT will continue progression as able.    Personal Factors and Comorbidities  Age;Behavior Pattern;Comorbidity 1;Sex;Fitness;Profession    Comorbidities  HTN    Examination-Activity Limitations  Lift;Carry;Sit;Squat    Examination-Participation  Restrictions  Laundry;Cleaning;Community Activity;Meal Prep    Stability/Clinical Decision Making  Evolving/Moderate complexity    Clinical Decision Making  Moderate    Rehab Potential  Good    PT Frequency  2x / week    PT Duration  8 weeks    PT Treatment/Interventions  ADLs/Self Care Home Management;Electrical Stimulation;Therapeutic activities;Patient/family education;Cryotherapy;Moist Heat;Iontophoresis 4mg /ml Dexamethasone;Functional mobility training;Manual techniques;Dry needling;Neuromuscular re-education;Therapeutic exercise;Passive range of motion;Spinal Manipulations;Joint Manipulations    PT Next Visit Plan  postural restoration    PT Home Exercise Plan  UT stretch, levator stretch, scapular retractions    Consulted and Agree with Plan of Care  Patient       Patient will benefit from skilled therapeutic intervention in order to improve the following deficits and impairments:  Increased fascial restricitons, Improper body mechanics, Pain, Postural dysfunction, Decreased activity tolerance, Impaired flexibility, Impaired UE functional use, Decreased strength  Visit Diagnosis: Cervicalgia  Chronic left shoulder pain     Problem List Patient Active Problem List   Diagnosis Date Noted  . Symptomatic mammary hypertrophy 07/07/2019  . Back pain 07/07/2019  . Shoulder pain 07/07/2019  . Facial rhytids 03/08/2015  . Obesity (BMI 30-39.9) 11/18/2011  . Hypertension 11/16/2011  . Contact dermatitis 11/16/2011  . Benign colonic polyp 11/16/2011   Shelton Silvas PT, DPT Shelton Silvas 07/20/2019, 8:11 AM  [facility] [address], [place], [address] Phone: [phone]   Fax:  [phone]  Name: Christina Conway MRN: [id number] Date of Birth: 1951/01/15

## 2019-07-22 ENCOUNTER — Encounter: Payer: 59 | Admitting: Physical Therapy

## 2019-07-27 ENCOUNTER — Ambulatory Visit: Payer: 59 | Admitting: Physical Therapy

## 2019-07-27 ENCOUNTER — Other Ambulatory Visit: Payer: Self-pay

## 2019-07-27 ENCOUNTER — Encounter: Payer: Self-pay | Admitting: Physical Therapy

## 2019-07-27 DIAGNOSIS — M542 Cervicalgia: Secondary | ICD-10-CM | POA: Diagnosis not present

## 2019-07-27 DIAGNOSIS — G8929 Other chronic pain: Secondary | ICD-10-CM

## 2019-07-27 DIAGNOSIS — M25512 Pain in left shoulder: Secondary | ICD-10-CM

## 2019-07-27 NOTE — Therapy (Signed)
[facility] [address] [place], [address] Phone: [phone]   Fax:  [phone]  Physical Therapy Treatment  Patient Details  Name: Christina Conway MRN: [id number] Date of Birth: Mar 28, 1951 No data recorded  Encounter Date: 07/27/2019  PT End of Session - 07/27/19 0740    Visit Number  3    Number of Visits  17    Date for PT Re-Evaluation  08/28/19    PT Start Time  0734    PT Stop Time  0812    PT Time Calculation (min)  38 min    Activity Tolerance  Patient tolerated treatment well    Behavior During Therapy  [facility] for tasks assessed/performed       Past Medical History:  Diagnosis Date  . Arthritis    fingers  . Chicken pox   . Dental bridge present    Permanent dental retainer - bottom front  . Diverticulosis   . GERD (gastroesophageal reflux disease)   . Headache(784.0)   . Hyperlipidemia   . Hypertension   . Vertigo    no episodes for over 10 years    Past Surgical History:  Procedure Laterality Date  . ABDOMINAL HYSTERECTOMY  08/02/1997  . bunion    . BUNIONECTOMY  06/03/1998 & 06/04/2003  . CATARACT EXTRACTION W/PHACO Right 03/04/2019   Procedure: CATARACT EXTRACTION PHACO AND INTRAOCULAR LENS PLACEMENT (IOC) RIGHT  3.88  00:33.8  11.4%;  Surgeon: Leandrew Koyanagi, MD;  Location: [place];  Service: Ophthalmology;  Laterality: Right;  . CATARACT EXTRACTION W/PHACO Left 04/01/2019   Procedure: CATARACT EXTRACTION PHACO AND INTRAOCULAR LENS PLACEMENT (IOC) LEFT  3.59  00:28.2  12.7%;  Surgeon: Leandrew Koyanagi, MD;  Location: [place];  Service: Ophthalmology;  Laterality: Left;  . HAND SURGERY  09/01/2002   righ hand    There were no vitals filed for this visit.  Subjective Assessment - 07/27/19 0737    Subjective  Patient did a lot of planting over the weekend, reports her back was 8/10 pain after this and her bra straps were digging into her and red.  Reports no back pain this am, but that her bra strap has made her skin raw on her shoulders.  and this is painful.    Pertinent History  Patient is a 69 year old female presenting with bilat neck pain d/t bra pain. Reports that her breasts have become heavy over the past few years and shows indention of her bra strap that she reports is constant. She has to move her bra strap to her shoulder by mid morining, though she does have very supportive bras. 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 She has a low grade headache with this neck/shoulder pain that is at the base of the skull and feels like a tension headache. Worst neck/shoulder pain 8/10; best 3/10. Reports her pain comes on at random and interferes with her job with typing at her computer, crafting/painting, gardening, doing things with her grandchildren, and comunity activities/errands. In sitting patient reports she is constantly trying to find ways to take pressure from breast. Pt denies N/V, B&B changes, unexplained weight fluctuation, saddle paresthesia, fever, night sweats, or unrelenting night pain at this time.    Limitations  Sitting;Lifting;House hold activities;Standing;Walking    How long can you sit comfortably?  uncomfortable for any extended period of  time, adjusts herself and puts pillows under her breasts    How long can you stand comfortably?  constantly adjusting    How long can you walk comfortably?  constantly adjusting    Diagnostic tests  None completed    Patient Stated Goals  Decrease neck and shoulder pain    Pain Onset  More than a month ago         Ther-Ex Nustep L4 36mins seat setting 6 UE setting 6 for gentle strengthening and tspine motion  Seated rows 20# 3x 10 with min cuing needed to prevent protraction at return from exercise Lat pull down 20# 3x 10 with good carry over from previous session, min cuing to prevent shoulder  hiking Bent over rows BW x10; with 1# DB 2x 10 with good carry over of demo and TC for set up and to prevent thoracic kyphosis and shoulder protraction with decent carry over Standing horizontal abduction GTB 3x 10 with heavy cuing for postural set up and cervical posture with good carry over Wall slides 2x 12 with demo and heavy cuing needed initially for maintaining scapular retraction with good carry over Seated theraball rollouts x4 15sec hold with good carry over of cuing for full ROM                        PT Education - 07/27/19 0740    Education Details  therex form    Person(s) Educated  Patient    Methods  Explanation;Demonstration;Verbal cues    Comprehension  Verbalized understanding;Returned demonstration;Verbal cues required       PT Short Term Goals - 07/13/19 0835      PT SHORT TERM GOAL #1   Title  Pt will be independent with HEP in order to improve strength and decrease back pain in order to improve pain-free function at home and work.    Baseline  07/13/19 HEP given    Time  8    Period  Weeks    Status  New        PT Long Term Goals - 07/13/19 0836      PT LONG TERM GOAL #1   Title  Patient will increase FOTO score to 68 to demonstrate predicted increase in functional mobility to complete ADLs    Baseline  07/13/19 61    Time  6    Period  Weeks    Status  New      PT LONG TERM GOAL #2   Title  Pt will decrease worst neck pain as reported on NPRS by at least 2 points in order to demonstrate clinically significant reduction in back pain.    Baseline  07/13/19 8/10    Time  6    Period  Weeks    Status  New      PT LONG TERM GOAL #3   Title  Pt will increase periscapular strength of by at least 1/2 MMT grade in order to demonstrate improvement in strength and function.    Baseline  07/13/19 3-/3- Y lower trap; 4-/4- T Scapular retractors; 4/4 I position; 4+/4+ Latissimus    Time  6    Period  Weeks    Status  New             Plan - 07/27/19 0754    Clinical Impression Statement  PT continued therex progression for periscapular activation for postural neutral with success. Patient requires demo and cuing for proper technique of  therex, but is able to comply with cuing with good motivation towards proper techniques. PT will continue progression as able.    Personal Factors and Comorbidities  Age;Behavior Pattern;Comorbidity 1;Sex;Fitness;Profession    Comorbidities  HTN    Examination-Activity Limitations  Lift;Carry;Sit;Squat    Examination-Participation Restrictions  Laundry;Cleaning;Community Activity;Meal Prep    Stability/Clinical Decision Making  Evolving/Moderate complexity    Clinical Decision Making  Moderate    Rehab Potential  Good    PT Frequency  2x / week    PT Duration  8 weeks    PT Treatment/Interventions  ADLs/Self Care Home Management;Electrical Stimulation;Therapeutic activities;Patient/family education;Cryotherapy;Moist Heat;Iontophoresis 4mg /ml Dexamethasone;Functional mobility training;Manual techniques;Dry needling;Neuromuscular re-education;Therapeutic exercise;Passive range of motion;Spinal Manipulations;Joint Manipulations    PT Next Visit Plan  postural restoration    PT Home Exercise Plan  UT stretch, levator stretch, scapular retractions    Consulted and Agree with Plan of Care  Patient       Patient will benefit from skilled therapeutic intervention in order to improve the following deficits and impairments:  Increased fascial restricitons, Improper body mechanics, Pain, Postural dysfunction, Decreased activity tolerance, Impaired flexibility, Impaired UE functional use, Decreased strength  Visit Diagnosis: Cervicalgia  Chronic left shoulder pain     Problem List Patient Active Problem List   Diagnosis Date Noted  . Symptomatic mammary hypertrophy 07/07/2019  . Back pain 07/07/2019  . Shoulder pain 07/07/2019  . Facial rhytids 03/08/2015  . Obesity (BMI  30-39.9) 11/18/2011  . Hypertension 11/16/2011  . Contact dermatitis 11/16/2011  . Benign colonic polyp 11/16/2011   Shelton Silvas PT, DPT Shelton Silvas 07/27/2019, 8:10 AM  [facility] [address], [place], [address] Phone: [phone]   Fax:  [phone]  Name: Christina Conway MRN: [id number] Date of Birth: [birth year]

## 2019-07-29 ENCOUNTER — Encounter: Payer: 59 | Admitting: Physical Therapy

## 2019-07-31 ENCOUNTER — Encounter: Payer: 59 | Admitting: Physical Therapy

## 2019-08-03 ENCOUNTER — Other Ambulatory Visit: Payer: Self-pay

## 2019-08-03 ENCOUNTER — Encounter: Payer: Self-pay | Admitting: Physical Therapy

## 2019-08-03 ENCOUNTER — Ambulatory Visit: Payer: 59 | Admitting: Physical Therapy

## 2019-08-03 DIAGNOSIS — M25512 Pain in left shoulder: Secondary | ICD-10-CM | POA: Diagnosis not present

## 2019-08-03 DIAGNOSIS — G8929 Other chronic pain: Secondary | ICD-10-CM | POA: Diagnosis not present

## 2019-08-03 DIAGNOSIS — M542 Cervicalgia: Secondary | ICD-10-CM | POA: Diagnosis not present

## 2019-08-03 NOTE — Therapy (Signed)
[facility] [address], [place], [address] Phone: [phone]   Fax:  [phone]  Physical Therapy Treatment  Patient Details  Name: Christina Conway MRN: [id number] Date of Birth: 1950-09-21 No data recorded  Encounter Date: 08/03/2019  PT End of Session - 08/03/19 0743    Visit Number  4    Number of Visits  17    Date for PT Re-Evaluation  08/28/19    PT Start Time  0733    PT Stop Time  0805    PT Time Calculation (min)  32 min    Activity Tolerance  Patient tolerated treatment well    Behavior During Therapy  [facility] for tasks assessed/performed       Past Medical History:  Diagnosis Date  . Arthritis    fingers  . Chicken pox   . Dental bridge present    Permanent dental retainer - bottom front  . Diverticulosis   . GERD (gastroesophageal reflux disease)   . Headache(784.0)   . Hyperlipidemia   . Hypertension   . Vertigo    no episodes for over 10 years    Past Surgical History:  Procedure Laterality Date  . ABDOMINAL HYSTERECTOMY  08/02/1997  . bunion    . BUNIONECTOMY  06/03/1998 & 06/04/2003  . CATARACT EXTRACTION W/PHACO Right 03/04/2019   Procedure: CATARACT EXTRACTION PHACO AND INTRAOCULAR LENS PLACEMENT (IOC) RIGHT  3.88  00:33.8  11.4%;  Surgeon: Leandrew Koyanagi, MD;  Location: [facility];  Service: Ophthalmology;  Laterality: Right;  . CATARACT EXTRACTION W/PHACO Left 04/01/2019   Procedure: CATARACT EXTRACTION PHACO AND INTRAOCULAR LENS PLACEMENT (IOC) LEFT  3.59  00:28.2  12.7%;  Surgeon: Leandrew Koyanagi, MD;  Location: [place];  Service: Ophthalmology;  Laterality: Left;  . HAND SURGERY  09/01/2002   righ hand    There were no vitals filed for this visit.  Subjective Assessment - 08/03/19 0739    Subjective  Patient reports Saturday she didn't wear a bra, which helped with her pain. Reports yesterday she had to wear a bra to work outside and by the  end of the day her shoulders hurt 8/10 where the bra had dug into them.    Pertinent History  Patient is a 69 year old female presenting with bilat neck pain d/t bra pain. Reports that her breasts have become heavy over the past few years and shows indention of her bra strap that she reports is constant. She has to move her bra strap to her shoulder by mid morining, though she does have very supportive bras. 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 She has a low grade headache with this neck/shoulder pain that is at the base of the skull and feels like a tension headache. Worst neck/shoulder pain 8/10; best 3/10. Reports her pain comes on at random and interferes with her job with typing at her computer, crafting/painting, gardening, doing things with her grandchildren, and comunity activities/errands. In sitting patient reports she is constantly trying to find ways to take pressure from breast. Pt denies N/V, B&B changes, unexplained weight fluctuation, saddle paresthesia, fever, night sweats, or unrelenting night pain at this time.    Limitations  Sitting;Lifting;House hold activities;Standing;Walking    How long can you sit comfortably?  uncomfortable for any extended period of time, adjusts herself and puts pillows under her breasts  How long can you stand comfortably?  constantly adjusting    How long can you walk comfortably?  constantly adjusting    Diagnostic tests  None completed    Patient Stated Goals  Decrease neck and shoulder pain    Pain Onset  More than a month ago       Ther-Ex Nustep L4 71mins seat setting 6 UE setting 6 for gentle strengthening and tspine motion  Standing high rows 10# 3x 10 with min cuing needed to maintain elbow height with good carry over Bent over flys with 1# DB 3x 10 with cuing needed for set up with good carry over following Standing horizontal abduction GTB 3x 10 with min cuing  for full scapular retraction and eccentric return with good carry over Wall slides x12 with demo and cuing for set up with good carry over; with RTB x12 with increased cuing for maintained ER with good carry over Seated theraball rollouts x4 15sec hold with good carry over of cuing for full ROM                            PT Short Term Goals - 07/13/19 0835      PT SHORT TERM GOAL #1   Title  Pt will be independent with HEP in order to improve strength and decrease back pain in order to improve pain-free function at home and work.    Baseline  07/13/19 HEP given    Time  8    Period  Weeks    Status  New        PT Long Term Goals - 07/13/19 0836      PT LONG TERM GOAL #1   Title  Patient will increase FOTO score to 68 to demonstrate predicted increase in functional mobility to complete ADLs    Baseline  07/13/19 61    Time  6    Period  Weeks    Status  New      PT LONG TERM GOAL #2   Title  Pt will decrease worst neck pain as reported on NPRS by at least 2 points in order to demonstrate clinically significant reduction in back pain.    Baseline  07/13/19 8/10    Time  6    Period  Weeks    Status  New      PT LONG TERM GOAL #3   Title  Pt will increase periscapular strength of by at least 1/2 MMT grade in order to demonstrate improvement in strength and function.    Baseline  07/13/19 3-/3- Y lower trap; 4-/4- T Scapular retractors; 4/4 I position; 4+/4+ Latissimus    Time  6    Period  Weeks    Status  New            Plan - 08/03/19 [id number]    Clinical Impression Statement  PT continued therex progression for increased postural strengthening with good success. Patient is able to complete all therex with proper form/technique with cuing. Patinet is continuing to report that she has pain from her bra digging into her shoulder, and continues to still be a good candidate for breast reduction. PT will continue progression as able.    Personal Factors  and Comorbidities  Age;Behavior Pattern;Comorbidity 1;Sex;Fitness;Profession    Comorbidities  HTN    Examination-Activity Limitations  Lift;Carry;Sit;Squat    Examination-Participation Restrictions  Laundry;Cleaning;Community Activity;Meal Prep    Stability/Clinical Decision Making  Evolving/Moderate  complexity    Clinical Decision Making  Moderate    Rehab Potential  Good    PT Frequency  2x / week    PT Duration  8 weeks    PT Treatment/Interventions  ADLs/Self Care Home Management;Electrical Stimulation;Therapeutic activities;Patient/family education;Cryotherapy;Moist Heat;Iontophoresis 4mg /ml Dexamethasone;Functional mobility training;Manual techniques;Dry needling;Neuromuscular re-education;Therapeutic exercise;Passive range of motion;Spinal Manipulations;Joint Manipulations    PT Next Visit Plan  postural restoration    PT Home Exercise Plan  UT stretch, levator stretch, scapular retractions    Consulted and Agree with Plan of Care  Patient       Patient will benefit from skilled therapeutic intervention in order to improve the following deficits and impairments:  Increased fascial restricitons, Improper body mechanics, Pain, Postural dysfunction, Decreased activity tolerance, Impaired flexibility, Impaired UE functional use, Decreased strength  Visit Diagnosis: Cervicalgia  Chronic left shoulder pain     Problem List Patient Active Problem List   Diagnosis Date Noted  . Symptomatic mammary hypertrophy 07/07/2019  . Back pain 07/07/2019  . Shoulder pain 07/07/2019  . Facial rhytids 03/08/2015  . Obesity (BMI 30-39.9) 11/18/2011  . Hypertension 11/16/2011  . Contact dermatitis 11/16/2011  . Benign colonic polyp 11/16/2011   Shelton Silvas PT, DPT Shelton Silvas 08/03/2019, 8:14 AM  [facility] [address], [place], [address] Phone: [phone]   Fax:  [phone]  Name: Christina Conway MRN:  [id number] Date of Birth: 1951-03-24

## 2019-08-05 ENCOUNTER — Encounter: Payer: 59 | Admitting: Physical Therapy

## 2019-08-12 ENCOUNTER — Other Ambulatory Visit: Payer: Self-pay

## 2019-08-12 ENCOUNTER — Ambulatory Visit: Payer: 59 | Admitting: Physical Therapy

## 2019-08-12 ENCOUNTER — Encounter: Payer: Self-pay | Admitting: Physical Therapy

## 2019-08-12 DIAGNOSIS — M542 Cervicalgia: Secondary | ICD-10-CM

## 2019-08-12 DIAGNOSIS — G8929 Other chronic pain: Secondary | ICD-10-CM

## 2019-08-12 DIAGNOSIS — M25512 Pain in left shoulder: Secondary | ICD-10-CM | POA: Diagnosis not present

## 2019-08-12 NOTE — Therapy (Signed)
[facility] [address] [place], [address] Phone: [phone]   Fax:  [phone]  Physical Therapy Treatment  Patient Details  Name: Christina Conway MRN: [id number] Date of Birth: [birth year] No data recorded  Encounter Date: 08/12/2019  PT End of Session - 08/12/19 0740    Visit Number  5    Number of Visits  17    Date for PT Re-Evaluation  08/28/19    PT Start Time  0732    PT Stop Time  0812    PT Time Calculation (min)  40 min    Activity Tolerance  Patient tolerated treatment well    Behavior During Therapy  [facility] for tasks assessed/performed       Past Medical History:  Diagnosis Date  . Arthritis    fingers  . Chicken pox   . Dental bridge present    Permanent dental retainer - bottom front  . Diverticulosis   . GERD (gastroesophageal reflux disease)   . Headache(784.0)   . Hyperlipidemia   . Hypertension   . Vertigo    no episodes for over 10 years    Past Surgical History:  Procedure Laterality Date  . ABDOMINAL HYSTERECTOMY  08/02/1997  . bunion    . BUNIONECTOMY  06/03/1998 & 06/04/2003  . CATARACT EXTRACTION W/PHACO Right 03/04/2019   Procedure: CATARACT EXTRACTION PHACO AND INTRAOCULAR LENS PLACEMENT (IOC) RIGHT  3.88  00:33.8  11.4%;  Surgeon: Leandrew Koyanagi, MD;  Location: [facility];  Service: Ophthalmology;  Laterality: Right;  . CATARACT EXTRACTION W/PHACO Left 04/01/2019   Procedure: CATARACT EXTRACTION PHACO AND INTRAOCULAR LENS PLACEMENT (IOC) LEFT  3.59  00:28.2  12.7%;  Surgeon: Leandrew Koyanagi, MD;  Location: [facility];  Service: Ophthalmology;  Laterality: Left;  . HAND SURGERY  09/01/2002   righ hand    There were no vitals filed for this visit.  Subjective Assessment - 08/12/19 0736    Subjective  Patient reports she is still having the bra digging into her shoulder pain no matter what type of bra she wears. Continues to have 8/10 pain     Pertinent History  Patient is a 69 year old female presenting with bilat neck pain d/t bra pain. Reports that her breasts have become heavy over the past few years and shows indention of her bra strap that she reports is constant. She has to move her bra strap to her shoulder by mid morining, though she does have very supportive bras. 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 She has a low grade headache with this neck/shoulder pain that is at the base of the skull and feels like a tension headache. Worst neck/shoulder pain 8/10; best 3/10. Reports her pain comes on at random and interferes with her job with typing at her computer, crafting/painting, gardening, doing things with her grandchildren, and comunity activities/errands. In sitting patient reports she is constantly trying to find ways to take pressure from breast. Pt denies N/V, B&B changes, unexplained weight fluctuation, saddle paresthesia, fever, night sweats, or unrelenting night pain at this time.    Limitations  Sitting;Lifting;House hold activities;Standing;Walking    How long can you sit comfortably?  uncomfortable for any extended period of time, adjusts herself and puts pillows under her breasts    How long can you stand comfortably?  constantly adjusting    How  long can you walk comfortably?  constantly adjusting    Diagnostic tests  None completed    Patient Stated Goals  Decrease neck and shoulder pain    Pain Onset  More than a month ago         Ther-Ex Nustep L45mins seat setting 6 UE setting 6 for gentle strengthening and tspine motion  Standing rows 15# 3x 10with min cuing needed for posture with set up with good carry over following Bent over rows (supinated grip) 15# 3x 10 with demo and cuing needed for set up scapular retraction with good carry over following Bent over flys with 1# DB 3x 10 with cuing needed for set up with good carry over  following Seated bilat ER at 90/90 x10 with cuing and demo initially with good carry over; with RTB 3x 10 with cuing for eccentric control  Wall slides x12 with demo and cuing for set up with good carry over; with RTB x12 with increased cuing for maintained ER with good carry over Seated theraball rollouts x2 60sec hold with good carry over of cuing for full ROM                       PT Education - 08/12/19 0740    Education Details  therex form    Person(s) Educated  Patient    Methods  Explanation;Demonstration;Verbal cues    Comprehension  Verbalized understanding;Returned demonstration;Verbal cues required       PT Short Term Goals - 07/13/19 0835      PT SHORT TERM GOAL #1   Title  Pt will be independent with HEP in order to improve strength and decrease back pain in order to improve pain-free function at home and work.    Baseline  07/13/19 HEP given    Time  8    Period  Weeks    Status  New        PT Long Term Goals - 07/13/19 0836      PT LONG TERM GOAL #1   Title  Patient will increase FOTO score to 68 to demonstrate predicted increase in functional mobility to complete ADLs    Baseline  07/13/19 61    Time  6    Period  Weeks    Status  New      PT LONG TERM GOAL #2   Title  Pt will decrease worst neck pain as reported on NPRS by at least 2 points in order to demonstrate clinically significant reduction in back pain.    Baseline  07/13/19 8/10    Time  6    Period  Weeks    Status  New      PT LONG TERM GOAL #3   Title  Pt will increase periscapular strength of by at least 1/2 MMT grade in order to demonstrate improvement in strength and function.    Baseline  07/13/19 3-/3- Y lower trap; 4-/4- T Scapular retractors; 4/4 I position; 4+/4+ Latissimus    Time  6    Period  Weeks    Status  New            Plan - 08/12/19 0747    Clinical Impression Statement  PT continued therex progresssion for increased postural strengthening with  good success. Patient is able to comply with all cuing for proper technique of therex with carry over. Patient is reporting same pain midway through the day, from bra strap, and continues to be   a good candidate for breast reduction d/t lack of evident decreased pain, despite improved posture and motor control. PT will continue to progress strengthening for 6 week time frame to ensure time period for true strengthening gains is met, but does continue to agree with Dr. Eusebio Friendly assessment, that patient would benefit from breast reduction. PT will continue progression as able.    Personal Factors and Comorbidities  Age;Behavior Pattern;Comorbidity 1;Sex;Fitness;Profession    Comorbidities  HTN    Examination-Activity Limitations  Lift;Carry;Sit;Squat    Examination-Participation Restrictions  Laundry;Cleaning;Community Activity;Meal Prep    Stability/Clinical Decision Making  Evolving/Moderate complexity    Clinical Decision Making  Moderate    Rehab Potential  Good    PT Frequency  2x / week    PT Duration  8 weeks    PT Treatment/Interventions  ADLs/Self Care Home Management;Electrical Stimulation;Therapeutic activities;Patient/family education;Cryotherapy;Moist Heat;Iontophoresis 67m/ml Dexamethasone;Functional mobility training;Manual techniques;Dry needling;Neuromuscular re-education;Therapeutic exercise;Passive range of motion;Spinal Manipulations;Joint Manipulations    PT Next Visit Plan  postural restoration    PT Home Exercise Plan  UT stretch, levator stretch, scapular retractions    Consulted and Agree with Plan of Care  Patient       Patient will benefit from skilled therapeutic intervention in order to improve the following deficits and impairments:  Increased fascial restricitons, Improper body mechanics, Pain, Postural dysfunction, Decreased activity tolerance, Impaired flexibility, Impaired UE functional use, Decreased strength  Visit Diagnosis: Cervicalgia  Chronic left  shoulder pain     Problem List Patient Active Problem List   Diagnosis Date Noted  . Symptomatic mammary hypertrophy 07/07/2019  . Back pain 07/07/2019  . Shoulder pain 07/07/2019  . Facial rhytids 03/08/2015  . Obesity (BMI 30-39.9) 11/18/2011  . Hypertension 11/16/2011  . Contact dermatitis 11/16/2011  . Benign colonic polyp 11/16/2011   CShelton SilvasPT, DPT CShelton Silvas4/28/2021, 8:11 AM  [facility] [address] [place] [address]Phone: [phone]  Fax:  [phone] Name: Christina WINTERHALTERMRN: [id number] of Birth: 609/15/1952

## 2019-08-14 ENCOUNTER — Encounter: Payer: 59 | Admitting: Physical Therapy

## 2019-08-17 ENCOUNTER — Ambulatory Visit: Payer: 59 | Attending: Plastic Surgery | Admitting: Physical Therapy

## 2019-08-17 ENCOUNTER — Other Ambulatory Visit: Payer: Self-pay

## 2019-08-17 ENCOUNTER — Encounter: Payer: Self-pay | Admitting: Physical Therapy

## 2019-08-17 DIAGNOSIS — M542 Cervicalgia: Secondary | ICD-10-CM | POA: Diagnosis not present

## 2019-08-17 DIAGNOSIS — G8929 Other chronic pain: Secondary | ICD-10-CM | POA: Insufficient documentation

## 2019-08-17 DIAGNOSIS — M25512 Pain in left shoulder: Secondary | ICD-10-CM | POA: Insufficient documentation

## 2019-08-17 NOTE — Therapy (Signed)
[facility] [address], [place], [address] Phone: [phone]   Fax:  [phone]  Physical Therapy Treatment  Patient Details  Name: Christina Conway MRN: [id number] Date of Birth: 12/16/1950 No data recorded  Encounter Date: 08/17/2019  PT End of Session - 08/17/19 0742    Visit Number  6    Number of Visits  17    Date for PT Re-Evaluation  08/28/19    PT Start Time  0734    PT Stop Time  0812    PT Time Calculation (min)  38 min    Activity Tolerance  Patient tolerated treatment well    Behavior During Therapy  [facility] for tasks assessed/performed       Past Medical History:  Diagnosis Date  . Arthritis    fingers  . Chicken pox   . Dental bridge present    Permanent dental retainer - bottom front  . Diverticulosis   . GERD (gastroesophageal reflux disease)   . Headache(784.0)   . Hyperlipidemia   . Hypertension   . Vertigo    no episodes for over 10 years    Past Surgical History:  Procedure Laterality Date  . ABDOMINAL HYSTERECTOMY  08/02/1997  . bunion    . BUNIONECTOMY  06/03/1998 & 06/04/2003  . CATARACT EXTRACTION W/PHACO Right 03/04/2019   Procedure: CATARACT EXTRACTION PHACO AND INTRAOCULAR LENS PLACEMENT (IOC) RIGHT  3.88  00:33.8  11.4%;  Surgeon: Leandrew Koyanagi, MD;  Location: [facility];  Service: Ophthalmology;  Laterality: Right;  . CATARACT EXTRACTION W/PHACO Left 04/01/2019   Procedure: CATARACT EXTRACTION PHACO AND INTRAOCULAR LENS PLACEMENT (IOC) LEFT  3.59  00:28.2  12.7%;  Surgeon: Leandrew Koyanagi, MD;  Location: [facility];  Service: Ophthalmology;  Laterality: Left;  . HAND SURGERY  09/01/2002   righ hand    There were no vitals filed for this visit.  Subjective Assessment - 08/17/19 0738    Subjective  Patient is still having shoulder pain from her bra, and reports that at the end of everyday the tops of her shoulders are raw from bra.    Pertinent History  Patient is a 69 year old female presenting with bilat neck pain d/t bra pain. Reports that her breasts have become heavy over the past few years and shows indention of her bra strap that she reports is constant. She has to move her bra strap to her shoulder by mid morining, though she does have very supportive bras. 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 She has a low grade headache with this neck/shoulder pain that is at the base of the skull and feels like a tension headache. Worst neck/shoulder pain 8/10; best 3/10. Reports her pain comes on at random and interferes with her job with typing at her computer, crafting/painting, gardening, doing things with her grandchildren, and comunity activities/errands. In sitting patient reports she is constantly trying to find ways to take pressure from breast. Pt denies N/V, B&B changes, unexplained weight fluctuation, saddle paresthesia, fever, night sweats, or unrelenting night pain at this time.    Limitations  Sitting;Lifting;House hold activities;Standing;Walking    How long can you sit comfortably?  uncomfortable for any extended period of time, adjusts herself and puts pillows under her breasts    How long can you stand comfortably?  constantly adjusting    How long  can you walk comfortably?  constantly adjusting    Diagnostic tests  None completed    Patient Stated Goals  Decrease neck and shoulder pain    Pain Onset  More than a month ago        Ther-Ex Nustep L57mins seat setting 6 UE setting 6 for gentle strengthening and tspine motion  Standing rows 15# x10; 20# 2x 10with min cuing needed for eccentric control  Bent over rows (supinated grip) 8# DB bilat 3x 10 with mod TC and VC needed for proper form with good carry over Bent overflyswith 2# DB3x 10 withcuing needed for set up with overall good carry over from prior sessions Seated military  press  Seated bilat ER at 90/90  with RTB x10; GTB 2x 10with cuing for eccentric control with good carry over following Seated theraball rollouts x2 60sec hold with good carry over of cuing for full ROM                         PT Education - 08/17/19 0741    Education Details  therex form    Person(s) Educated  Patient    Methods  Explanation;Demonstration;Verbal cues    Comprehension  Verbalized understanding;Returned demonstration;Verbal cues required       PT Short Term Goals - 07/13/19 0835      PT SHORT TERM GOAL #1   Title  Pt will be independent with HEP in order to improve strength and decrease back pain in order to improve pain-free function at home and work.    Baseline  07/13/19 HEP given    Time  8    Period  Weeks    Status  New        PT Long Term Goals - 07/13/19 0836      PT LONG TERM GOAL #1   Title  Patient will increase FOTO score to 68 to demonstrate predicted increase in functional mobility to complete ADLs    Baseline  07/13/19 61    Time  6    Period  Weeks    Status  New      PT LONG TERM GOAL #2   Title  Pt will decrease worst neck pain as reported on NPRS by at least 2 points in order to demonstrate clinically significant reduction in back pain.    Baseline  07/13/19 8/10    Time  6    Period  Weeks    Status  New      PT LONG TERM GOAL #3   Title  Pt will increase periscapular strength of by at least 1/2 MMT grade in order to demonstrate improvement in strength and function.    Baseline  07/13/19 3-/3- Y lower trap; 4-/4- T Scapular retractors; 4/4 I position; 4+/4+ Latissimus    Time  6    Period  Weeks    Status  New            Plan - 08/17/19 0746    Clinical Impression Statement  PT continued therex progression for increased postural strengthening with good success. Patient has continued non-MSK related pain, but reports her back feels stronger overall. PT will re-assess progress next week, following 6 week  benchmark needed for true strengthening progress to be achieved. Patient is able to comply with all cuing for proper technique of therex and is motivated throughout session. PT will continue progression as ablel.    Personal Factors and Comorbidities  Age;Behavior  Pattern;Comorbidity 1;Sex;Fitness;Profession    Comorbidities  HTN    Examination-Activity Limitations  Lift;Carry;Sit;Squat    Stability/Clinical Decision Making  Evolving/Moderate complexity    Clinical Decision Making  Moderate    Rehab Potential  Good    PT Frequency  2x / week    PT Duration  8 weeks    PT Treatment/Interventions  ADLs/Self Care Home Management;Electrical Stimulation;Therapeutic activities;Patient/family education;Cryotherapy;Moist Heat;Iontophoresis 4mg /ml Dexamethasone;Functional mobility training;Manual techniques;Dry needling;Neuromuscular re-education;Therapeutic exercise;Passive range of motion;Spinal Manipulations;Joint Manipulations    PT Next Visit Plan  postural restoration    PT Home Exercise Plan  UT stretch, levator stretch, scapular retractions    Consulted and Agree with Plan of Care  Patient       Patient will benefit from skilled therapeutic intervention in order to improve the following deficits and impairments:  Increased fascial restricitons, Improper body mechanics, Pain, Postural dysfunction, Decreased activity tolerance, Impaired flexibility, Impaired UE functional use, Decreased strength  Visit Diagnosis: Cervicalgia  Chronic left shoulder pain     Problem List Patient Active Problem List   Diagnosis Date Noted  . Symptomatic mammary hypertrophy 07/07/2019  . Back pain 07/07/2019  . Shoulder pain 07/07/2019  . Facial rhytids 03/08/2015  . Obesity (BMI 30-39.9) 11/18/2011  . Hypertension 11/16/2011  . Contact dermatitis 11/16/2011  . Benign colonic polyp 11/16/2011   Shelton Silvas PT, DPT Shelton Silvas 08/17/2019, 8:08 AM  [place]  [facility] [address], [place], [address] Phone: [phone]   Fax:  [phone]  Name: BERNISE TYMINSKI MRN: [id number] Date of Birth: 11/10/1950

## 2019-08-24 ENCOUNTER — Encounter: Payer: Self-pay | Admitting: Physical Therapy

## 2019-08-24 ENCOUNTER — Other Ambulatory Visit: Payer: Self-pay

## 2019-08-24 ENCOUNTER — Ambulatory Visit: Payer: 59 | Admitting: Physical Therapy

## 2019-08-24 DIAGNOSIS — G8929 Other chronic pain: Secondary | ICD-10-CM

## 2019-08-24 DIAGNOSIS — M542 Cervicalgia: Secondary | ICD-10-CM | POA: Diagnosis not present

## 2019-08-24 DIAGNOSIS — M25512 Pain in left shoulder: Secondary | ICD-10-CM

## 2019-08-24 NOTE — Therapy (Signed)
[facility] [address], [place], [address] Phone: [phone]   Fax:  [phone]  Physical Therapy Treatment/Discharge Summary Reporting Period 07/13/19 - 08/24/19  Patient Details  Name: Christina Conway MRN: [id number] Date of Birth: 03-Dec-1950 No data recorded  Encounter Date: 08/24/2019  PT End of Session - 08/24/19 0739    Visit Number  7    Number of Visits  17    Date for PT Re-Evaluation  08/28/19    PT Start Time  0733    PT Stop Time  0800    PT Time Calculation (min)  27 min    Activity Tolerance  Patient tolerated treatment well    Behavior During Therapy  [facility] for tasks assessed/performed       Past Medical History:  Diagnosis Date  . Arthritis    fingers  . Chicken pox   . Dental bridge present    Permanent dental retainer - bottom front  . Diverticulosis   . GERD (gastroesophageal reflux disease)   . Headache(784.0)   . Hyperlipidemia   . Hypertension   . Vertigo    no episodes for over 10 years    Past Surgical History:  Procedure Laterality Date  . ABDOMINAL HYSTERECTOMY  08/02/1997  . bunion    . BUNIONECTOMY  06/03/1998 & 06/04/2003  . CATARACT EXTRACTION W/PHACO Right 03/04/2019   Procedure: CATARACT EXTRACTION PHACO AND INTRAOCULAR LENS PLACEMENT (IOC) RIGHT  3.88  00:33.8  11.4%;  Surgeon: Leandrew Koyanagi, MD;  Location: [facility];  Service: Ophthalmology;  Laterality: Right;  . CATARACT EXTRACTION W/PHACO Left 04/01/2019   Procedure: CATARACT EXTRACTION PHACO AND INTRAOCULAR LENS PLACEMENT (IOC) LEFT  3.59  00:28.2  12.7%;  Surgeon: Leandrew Koyanagi, MD;  Location: [facility];  Service: Ophthalmology;  Laterality: Left;  . HAND SURGERY  09/01/2002   righ hand    There were no vitals filed for this visit.  Subjective Assessment - 08/24/19 0737    Subjective  Patient continues to report no back pain, only intense pain from the bra digging into  the top of her shoulders.    Pertinent History  Patient is a 69 year old female presenting with bilat neck pain d/t bra pain. Reports that her breasts have become heavy over the past few years and shows indention of her bra strap that she reports is constant. She has to move her bra strap to her shoulder by mid morining, though she does have very supportive bras. 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 She has a low grade headache with this neck/shoulder pain that is at the base of the skull and feels like a tension headache. Worst neck/shoulder pain 8/10; best 3/10. Reports her pain comes on at random and interferes with her job with typing at her computer, crafting/painting, gardening, doing things with her grandchildren, and comunity activities/errands. In sitting patient reports she is constantly trying to find ways to take pressure from breast. Pt denies N/V, B&B changes, unexplained weight fluctuation, saddle paresthesia, fever, night sweats, or unrelenting night pain at this time.    Limitations  Sitting;Lifting;House hold activities;Standing;Walking    How long can you sit comfortably?  uncomfortable for any extended period of time, adjusts herself and puts pillows under her breasts    How long can you stand comfortably?  constantly adjusting    How  long can you walk comfortably?  constantly adjusting    Diagnostic tests  None completed    Patient Stated Goals  Decrease neck and shoulder pain    Pain Onset  More than a month ago       Ther-Ex Nustep L50mns seat setting 6 UE setting 6 for gentle strengthening and tspine motion  PT visually reveiwed the following therex with patient with patient verbalizing good understanding of technique of all therex. PT educated pt on frequency, rep/set range, how/when to decrease/increase resistance, and of all parameters for strenghening Access Code: [email or web address]  Standing  Bent Over Shoulder Row with Resistance - 1 x daily - 2-3 x weekly - 3 sets - 10 reps  Standing Shoulder Horizontal Abduction with Resistance - 1 x daily - 2-3 x weekly - 3 sets - 10 reps  Standing High Row with Resistance - 1 x daily - 2-3 x weekly - 3 sets - 10 reps  Standing Shoulder Row with Anchored Resistance - 1 x daily - 2-3 x weekly - 3 sets - 10 reps Bent Over Rows - 1 x daily - 2-3 x weekly - 3 sets - 10 reps Wall Slides  - 1 x daily - 2-3 x weekly - 3 sets - 10 reps                         PT Education - 08/24/19 0738    Education Details  HEP and d/c recommendations    Person(s) Educated  Patient    Methods  Explanation;Demonstration;Verbal cues    Comprehension  Verbalized understanding;Returned demonstration;Verbal cues required       PT Short Term Goals - 08/24/19 0739      PT SHORT TERM GOAL #1   Title  Pt will be independent with HEP in order to improve strength and decrease back pain in order to improve pain-free function at home and work.    Baseline  07/13/19 HEP given    Time  8    Period  Weeks    Status  Achieved        PT Long Term Goals - 08/24/19 0739      PT LONG TERM GOAL #1   Title  Patient will increase FOTO score to 68 to demonstrate predicted increase in functional mobility to complete ADLs    Baseline  07/13/19 61; 08/24/19 99    Time  6    Period  Weeks    Status  Achieved      PT LONG TERM GOAL #2   Title  Pt will decrease worst neck pain as reported on NPRS by at least 2 points in order to demonstrate clinically significant reduction in back pain.    Baseline  07/13/19 8/10; 08/24/19 patient reports no back or neck back, just severe 7/10 pain where her bra digs into her shoulders    Time  6    Period  Weeks    Status  Partially Met      PT LONG TERM GOAL #3   Title  Pt will increase periscapular strength of by at least 1/2 MMT grade in order to demonstrate improvement in strength and function.    Baseline  07/13/19  3-/3- Y lower trap; 4-/4- T Scapular retractors; 4/4 I position; 4+/4+ Latissimus; 08/24/19 4-/4- Y lower trap; 4+/4+ T Scapular retractors; 5/5 I position; 5/5 Latissimus    Time  6    Period  Weeks    Status  Achieved            Plan - 08/24/19 0813    Clinical Impression Statement  PT reassessed goals this session, where patient has met all strength and function goals, with remaining pain soley from bra strap. It is this PT's opinion that patient would benefit from alternate consultation for pain. Patient is able to demonstrate good understanding of HEP recommendations for maintainance of postural strengthening. Patient given clinic contact info should any further questions or concerns.    Personal Factors and Comorbidities  Age;Behavior Pattern;Comorbidity 1;Sex;Fitness;Profession    Comorbidities  HTN    Examination-Activity Limitations  Lift;Carry;Sit;Squat    Examination-Participation Restrictions  Laundry;Cleaning;Community Activity;Meal Prep    Stability/Clinical Decision Making  Evolving/Moderate complexity    Clinical Decision Making  Moderate    Rehab Potential  Good    PT Frequency  2x / week    PT Duration  8 weeks    PT Treatment/Interventions  ADLs/Self Care Home Management;Electrical Stimulation;Therapeutic activities;Patient/family education;Cryotherapy;Moist Heat;Iontophoresis 4m/ml Dexamethasone;Functional mobility training;Manual techniques;Dry needling;Neuromuscular re-education;Therapeutic exercise;Passive range of motion;Spinal Manipulations;Joint Manipulations    PT Next Visit Plan  postural restoration    PT Home Exercise Plan  UT stretch, levator stretch, scapular retractions    Consulted and Agree with Plan of Care  Patient       Patient will benefit from skilled therapeutic intervention in order to improve the following deficits and impairments:  Increased fascial restricitons, Improper body mechanics, Pain, Postural dysfunction, Decreased activity  tolerance, Impaired flexibility, Impaired UE functional use, Decreased strength  Visit Diagnosis: Cervicalgia  Chronic left shoulder pain     Problem List Patient Active Problem List   Diagnosis Date Noted  . Symptomatic mammary hypertrophy 07/07/2019  . Back pain 07/07/2019  . Shoulder pain 07/07/2019  . Facial rhytids 03/08/2015  . Obesity (BMI 30-39.9) 11/18/2011  . Hypertension 11/16/2011  . Contact dermatitis 11/16/2011  . Benign colonic polyp 11/16/2011   CShelton SilvasPT, DPT CShelton Silvas5/01/2020, 8:53 AM  [facility] [address] [place] [address]Phone: [phone]  Fax:  [phone] Name: MNINI CAVANMRN: [id number] of Birth: [birth year]

## 2019-08-31 ENCOUNTER — Encounter: Payer: 59 | Admitting: Physical Therapy

## 2019-09-17 ENCOUNTER — Encounter: Payer: 59 | Admitting: Plastic Surgery

## 2019-09-30 ENCOUNTER — Encounter: Payer: 59 | Admitting: Plastic Surgery

## 2019-10-01 ENCOUNTER — Encounter: Payer: 59 | Admitting: Plastic Surgery

## 2019-10-26 ENCOUNTER — Other Ambulatory Visit: Payer: Self-pay | Admitting: Physician Assistant

## 2019-10-26 DIAGNOSIS — Z1231 Encounter for screening mammogram for malignant neoplasm of breast: Secondary | ICD-10-CM

## 2019-11-03 ENCOUNTER — Ambulatory Visit
Admission: RE | Admit: 2019-11-03 | Discharge: 2019-11-03 | Disposition: A | Discharge status: Home / Self Care | Payer: 59 | Source: Ambulatory Visit | Attending: Physician Assistant | Admitting: Physician Assistant

## 2019-11-03 DIAGNOSIS — Z1231 Encounter for screening mammogram for malignant neoplasm of breast: Secondary | ICD-10-CM | POA: Insufficient documentation

## 2019-11-05 ENCOUNTER — Telehealth: Payer: Self-pay

## 2019-11-05 NOTE — Telephone Encounter (Signed)
-----   Message from Mar Daring, [place] sent at 11/05/2019 10:02 AM EDT ----- Normal mammogram. Repeat screening in one year.

## 2019-11-05 NOTE — Telephone Encounter (Signed)
Patient advised as directed below. 

## 2019-11-12 DIAGNOSIS — H04123 Dry eye syndrome of bilateral lacrimal glands: Secondary | ICD-10-CM | POA: Diagnosis not present

## 2020-01-05 DIAGNOSIS — K1370 Unspecified lesions of oral mucosa: Secondary | ICD-10-CM | POA: Diagnosis not present

## 2020-03-01 NOTE — Progress Notes (Signed)
Complete physical exam   Patient: Christina Conway   DOB: Jun 04, 1950   69 y.o. Female  MRN: [id number] Visit Date: 03/02/2020  Today's healthcare provider: Mar Daring, PA-C   Chief Complaint  Patient presents with  . Annual Exam   Subjective    Christina Conway is a 69 y.o. female who presents today for a complete physical exam.  She reports consuming a general diet. The patient does not participate in regular exercise at present. She generally feels well. She reports sleeping well. She does have additional problems to discuss today. She would like to talk about her arthritis and also a bruise on her left foot. She also has been getting more fever blisters lately. HPI  02/17/18-Pap is normal, HPV negative. Will repeat in 3-5 years if desired. 11/03/2019-Normal mammogram. Repeat screening in one year. 01/27/13-Colonoscopy done-repeat in 10 yrs  Declined Influenza Vaccine.  Past Medical History:  Diagnosis Date  . Arthritis    fingers  . Chicken pox   . Dental bridge present    Permanent dental retainer - bottom front  . Diverticulosis   . GERD (gastroesophageal reflux disease)   . Headache(784.0)   . Hyperlipidemia   . Hypertension   . Vertigo    no episodes for over 10 years   Past Surgical History:  Procedure Laterality Date  . ABDOMINAL HYSTERECTOMY  08/02/1997  . bunion    . BUNIONECTOMY  06/03/1998 & 06/04/2003  . CATARACT EXTRACTION W/PHACO Right 03/04/2019   Procedure: CATARACT EXTRACTION PHACO AND INTRAOCULAR LENS PLACEMENT (IOC) RIGHT  3.88  00:33.8  11.4%;  Surgeon: Leandrew Koyanagi, MD;  Location: [facility];  Service: Ophthalmology;  Laterality: Right;  . CATARACT EXTRACTION W/PHACO Left 04/01/2019   Procedure: CATARACT EXTRACTION PHACO AND INTRAOCULAR LENS PLACEMENT (IOC) LEFT  3.59  00:28.2  12.7%;  Surgeon: Leandrew Koyanagi, MD;  Location: [facility];  Service: Ophthalmology;  Laterality: Left;  . HAND SURGERY  09/01/2002    righ hand   Social History   Socioeconomic History  . Marital status: Married    Spouse name: Not on file  . Number of children: 1  . Years of education: 60  . Highest education level: Not on file  Occupational History  . Not on file  Tobacco Use  . Smoking status: Never Smoker  . Smokeless tobacco: Never Used  Vaping Use  . Vaping Use: Never used  Substance and Sexual Activity  . Alcohol use: Yes    Comment: 1-2 a month  . Drug use: No  . Sexual activity: Not on file  Other Topics Concern  . Not on file  Social History Narrative  . Not on file   Social Determinants of Health   Financial Resource Strain:   . Difficulty of Paying Living Expenses: Not on file  Food Insecurity:   . Worried About Charity fundraiser in the Last Year: Not on file  . Ran Out of Food in the Last Year: Not on file  Transportation Needs:   . Lack of Transportation (Medical): Not on file  . Lack of Transportation (Non-Medical): Not on file  Physical Activity:   . Days of Exercise per Week: Not on file  . Minutes of Exercise per Session: Not on file  Stress:   . Feeling of Stress : Not on file  Social Connections:   . Frequency of Communication with Friends and Family: Not on file  . Frequency of Social Gatherings with  Friends and Family: Not on file  . Attends Religious Services: Not on file  . Active Member of Clubs or Organizations: Not on file  . Attends Archivist Meetings: Not on file  . Marital Status: Not on file  Intimate Partner Violence:   . Fear of Current or Ex-Partner: Not on file  . Emotionally Abused: Not on file  . Physically Abused: Not on file  . Sexually Abused: Not on file   Family Status  Relation Name Status  . Mother  Deceased at age 73       congestive health failure  . Father  Deceased at age 39       Congestive Heart Failure  . Sister  Alive  . Neg Hx  (Not Specified)   Family History  Problem Relation Age of Onset  . Arthritis Mother   .  Heart disease Mother   . Hypertension Mother   . Heart murmur Mother   . Arthritis Father   . Heart disease Father   . Hypertension Father   . Heart murmur Father   . Breast cancer Neg Hx    Allergies  Allergen Reactions  . Penicillins Rash    Patient Care Team: Rubye Beach as PCP - General (Physician Assistant)   Medications: Outpatient Medications Prior to Visit  Medication Sig  . ALPRAZolam (XANAX) 0.25 MG tablet Take 1 tablet (0.25 mg total) by mouth at bedtime as needed.  Marland Kitchen losartan (COZAAR) 100 MG tablet Take 1 tablet (100 mg total) by mouth daily.  . potassium chloride SA (KLOR-CON M20) 20 MEQ tablet Take 1 tablet (20 mEq total) by mouth daily.  Marland Kitchen triamterene-hydrochlorothiazide (DYAZIDE) 37.5-25 MG capsule Take 1 each (1 capsule total) by mouth daily.  . TURMERIC CURCUMIN PO Take by mouth daily.   No facility-administered medications prior to visit.    Review of Systems  Constitutional: Negative.   HENT: Negative.   Eyes: Negative.   Respiratory: Negative.   Cardiovascular: Negative.   Gastrointestinal: Negative.   Endocrine: Negative.   Genitourinary: Negative.   Musculoskeletal: Positive for arthralgias (hands).  Skin: Negative.   Allergic/Immunologic: Negative.   Neurological: Negative.   Hematological: Negative.   Psychiatric/Behavioral: Negative.     Last CBC Lab Results  Component Value Date   WBC 6.3 02/26/2019   HGB 13.2 02/26/2019   HCT 41.4 02/26/2019   MCV 99 (H) 02/26/2019   MCH 31.5 02/26/2019   RDW 13.0 02/26/2019   PLT 328 10/25/1973   Last metabolic panel Lab Results  Component Value Date   GLUCOSE 92 02/26/2019   NA 139 02/26/2019   K 3.9 02/26/2019   CL 101 02/26/2019   CO2 23 02/26/2019   BUN 31 (H) 02/26/2019   CREATININE 0.65 02/26/2019   GFRNONAA 93 02/26/2019   GFRAA 107 02/26/2019   CALCIUM 10.0 02/26/2019   PROT 6.8 02/26/2019   ALBUMIN 4.2 02/26/2019   LABGLOB 2.6 02/26/2019   AGRATIO 1.6  02/26/2019   BILITOT <0.2 02/26/2019   ALKPHOS 123 (H) 02/26/2019   AST 22 02/26/2019   ALT 22 02/26/2019      Objective    BP 140/90 (BP Location: Left Arm, Patient Position: Sitting, Cuff Size: Large)   Pulse 71   Temp 98.5 F (36.9 C) (Oral)   Resp 16   Ht 5\' 4"  (1.626 m)   Wt 206 lb 14.4 oz (93.8 kg)   BMI 35.51 kg/m  BP Readings from Last 3 Encounters:  03/02/20  140/90  07/07/19 137/87  04/01/19 (!) 154/95   Wt Readings from Last 3 Encounters:  03/02/20 206 lb 14.4 oz (93.8 kg)  07/07/19 203 lb 9.6 oz (92.4 kg)  04/01/19 201 lb 11.2 oz (91.5 kg)      Physical Exam Vitals reviewed.  Constitutional:      General: She is not in acute distress.    Appearance: Normal appearance. She is well-developed. She is obese. She is not ill-appearing or diaphoretic.  HENT:     Head: Normocephalic and atraumatic.     Right Ear: Tympanic membrane, ear canal and external ear normal.     Left Ear: Tympanic membrane, ear canal and external ear normal.     Nose: Nose normal.     Mouth/Throat:     Mouth: Mucous membranes are moist.     Pharynx: Oropharynx is clear. No oropharyngeal exudate.  Eyes:     General: No scleral icterus.       Right eye: No discharge.        Left eye: No discharge.     Extraocular Movements: Extraocular movements intact.     Conjunctiva/sclera: Conjunctivae normal.     Pupils: Pupils are equal, round, and reactive to light.  Neck:     Thyroid: No thyromegaly.     Vascular: No carotid bruit or JVD.     Trachea: No tracheal deviation.  Cardiovascular:     Rate and Rhythm: Normal rate and regular rhythm.     Pulses: Normal pulses.     Heart sounds: Normal heart sounds. No murmur heard.  No friction rub. No gallop.   Pulmonary:     Effort: Pulmonary effort is normal. No respiratory distress.     Breath sounds: Normal breath sounds. No wheezing or rales.  Chest:     Chest wall: No tenderness.  Abdominal:     General: Abdomen is flat. Bowel sounds  are normal. There is no distension.     Palpations: Abdomen is soft. There is no mass.     Tenderness: There is no abdominal tenderness. There is no guarding or rebound.  Musculoskeletal:        General: No tenderness. Normal range of motion.     Cervical back: Normal range of motion and neck supple. No tenderness.     Right lower leg: No edema.     Left lower leg: No edema (varicose vein noted over the anterior ankle joint and lateral dorsal surface of the left foot).  Lymphadenopathy:     Cervical: No cervical adenopathy.  Skin:    General: Skin is warm and dry.     Capillary Refill: Capillary refill takes less than 2 seconds.     Findings: No rash.  Neurological:     General: No focal deficit present.     Mental Status: She is alert and oriented to person, place, and time. Mental status is at baseline.  Psychiatric:        Mood and Affect: Mood normal.        Behavior: Behavior normal.        Thought Content: Thought content normal.        Judgment: Judgment normal.     Last depression screening scores PHQ 2/9 Scores 02/26/2019 02/17/2018 01/08/2017  PHQ - 2 Score 0 0 0  PHQ- 9 Score 0 - -   Last fall risk screening Fall Risk  02/26/2019  Falls in the past year? 0  Number falls in past yr: 0  Injury with Fall? 0  Follow up Falls evaluation completed   Last Audit-C alcohol use screening Alcohol Use Disorder Test (AUDIT) 02/26/2019  1. How often do you have a drink containing alcohol? 1  2. How many drinks containing alcohol do you have on a typical day when you are drinking? 0  3. How often do you have six or more drinks on one occasion? 0  AUDIT-C Score 1  Alcohol Brief Interventions/Follow-up AUDIT Score <7 follow-up not indicated   A score of 3 or more in women, and 4 or more in men indicates increased risk for alcohol abuse, EXCEPT if all of the points are from question 1   No results found for any visits on 03/02/20.  Assessment & Plan    Routine Health  Maintenance and Physical Exam  Exercise Activities and Dietary recommendations Goals   None     Immunization History  Administered Date(s) Administered  . Pneumococcal Conjugate-13 02/17/2018  . Tdap 02/17/2018    Health Maintenance  Topic Date Due  . COVID-19 Vaccine (1) Never done  . PNA vac Low Risk Adult (2 of 2 - PPSV23) 02/18/2019  . INFLUENZA VACCINE  Never done  . MAMMOGRAM  11/02/2021  . COLONOSCOPY  01/28/2023  . TETANUS/TDAP  02/18/2028  . DEXA SCAN  Completed  . Hepatitis C Screening  Completed    Discussed health benefits of physical activity, and encouraged her to engage in regular exercise appropriate for her age and condition.  1. Annual physical exam Normal physical exam today. Will check labs as below and f/u pending lab results. If labs are stable and WNL she will not need to have these rechecked for one year at her next annual physical exam. She is to call the office in the meantime if she has any acute issue, questions or concerns. - CBC with Differential/Platelet - Comprehensive metabolic panel - Hemoglobin A1c - Lipid panel - TSH  2. Essential hypertension Stable. Continue Triamterene-HCTZ 37.5-25mg  and losartan 100mg  daily. Will check labs as below and f/u pending results. - CBC with Differential/Platelet - Comprehensive metabolic panel - Hemoglobin A1c - Lipid panel - TSH - losartan (COZAAR) 100 MG tablet; Take 1 tablet (100 mg total) by mouth daily.  Dispense: 90 tablet; Refill: 3 - potassium chloride SA (KLOR-CON M20) 20 MEQ tablet; Take 1 tablet (20 mEq total) by mouth daily.  Dispense: 90 tablet; Refill: 3 - triamterene-hydrochlorothiazide (DYAZIDE) 37.5-25 MG capsule; Take 1 each (1 capsule total) by mouth daily.  Dispense: 90 capsule; Refill: 3  3. Class 2 severe obesity due to excess calories with serious comorbidity and body mass index (BMI) of 35.0 to 35.9 in adult [facility]) Counseled patient on healthy lifestyle modifications including  dieting and exercise.  - CBC with Differential/Platelet - Comprehensive metabolic panel - Hemoglobin A1c - Lipid panel - TSH  4. Cold sore Worsening. Will give Valtrex as below to use prn.  - valACYclovir (VALTREX) 1000 MG tablet; Take 1 tablet (1,000 mg total) by mouth 2 (two) times daily.  Dispense: 30 tablet; Refill: 0  5. Acute anxiety Stable. Diagnosis pulled for medication refill. Continue current medical treatment plan. - ALPRAZolam (XANAX) 0.25 MG tablet; Take 1 tablet (0.25 mg total) by mouth at bedtime as needed.  Dispense: 30 tablet; Refill: 0  6. Varicose veins of left lower extremity with pain Noted on left lower extremity. Associated with localized tenderness. Advised to elevate legs when she can. Discussed wearing compression stockings while awake, remove at bedtime. Referral  to vein clinic to see if anything else can be done to help.  - Ambulatory referral to Vascular Surgery  7. Arthritis Discussed adding Tumeric.    No follow-ups on file.     Reynolds Bowl, PA-C, have reviewed all documentation for this visit. The documentation on 03/03/20 for the exam, diagnosis, procedures, and orders are all accurate and complete.   Rubye Beach  [facility] [phone] (phone) [phone] (fax)  [place]

## 2020-03-02 ENCOUNTER — Encounter: Payer: Self-pay | Admitting: Physician Assistant

## 2020-03-02 ENCOUNTER — Ambulatory Visit (INDEPENDENT_AMBULATORY_CARE_PROVIDER_SITE_OTHER): Payer: 59 | Admitting: Physician Assistant

## 2020-03-02 ENCOUNTER — Other Ambulatory Visit: Payer: Self-pay

## 2020-03-02 VITALS — BP 140/90 | HR 71 | Temp 98.5°F | Resp 16 | Ht 64.0 in | Wt 206.9 lb

## 2020-03-02 DIAGNOSIS — F419 Anxiety disorder, unspecified: Secondary | ICD-10-CM | POA: Diagnosis not present

## 2020-03-02 DIAGNOSIS — M1990 Unspecified osteoarthritis, unspecified site: Secondary | ICD-10-CM | POA: Diagnosis not present

## 2020-03-02 DIAGNOSIS — I83812 Varicose veins of left lower extremities with pain: Secondary | ICD-10-CM | POA: Diagnosis not present

## 2020-03-02 DIAGNOSIS — B001 Herpesviral vesicular dermatitis: Secondary | ICD-10-CM | POA: Diagnosis not present

## 2020-03-02 DIAGNOSIS — I10 Essential (primary) hypertension: Secondary | ICD-10-CM | POA: Diagnosis not present

## 2020-03-02 DIAGNOSIS — Z0000 Encounter for general adult medical examination without abnormal findings: Secondary | ICD-10-CM | POA: Diagnosis not present

## 2020-03-02 DIAGNOSIS — Z6835 Body mass index (BMI) 35.0-35.9, adult: Secondary | ICD-10-CM | POA: Diagnosis not present

## 2020-03-02 DIAGNOSIS — R69 Illness, unspecified: Secondary | ICD-10-CM | POA: Diagnosis not present

## 2020-03-02 MED ORDER — POTASSIUM CHLORIDE CRYS ER 20 MEQ PO TBCR: 20.0000 meq | EXTENDED_RELEASE_TABLET | Freq: Every day | ORAL | 3 refills | Status: DC

## 2020-03-02 MED ORDER — TRIAMTERENE-HCTZ 37.5-25 MG PO CAPS: 1.0000 | ORAL_CAPSULE | Freq: Every day | ORAL | 3 refills | Status: DC

## 2020-03-02 MED ORDER — ALPRAZOLAM 0.25 MG PO TABS: 0.2500 mg | ORAL_TABLET | Freq: Every evening | ORAL | 0 refills | Status: DC | PRN

## 2020-03-02 MED ORDER — LOSARTAN POTASSIUM 100 MG PO TABS: 100.0000 mg | ORAL_TABLET | Freq: Every day | ORAL | 3 refills | Status: DC

## 2020-03-02 MED ORDER — VALACYCLOVIR HCL 1 G PO TABS: 1000.0000 mg | ORAL_TABLET | Freq: Two times a day (BID) | ORAL | 0 refills | Status: AC

## 2020-03-02 NOTE — Patient Instructions (Addendum)
Preventive Care 67 Years and Older, Female Preventive care refers to lifestyle choices and visits with your health care provider that can promote health and wellness. This includes:  A yearly physical exam. This is also called an annual well check.  Regular dental and eye exams.  Immunizations.  Screening for certain conditions.  Healthy lifestyle choices, such as diet and exercise. What can I expect for my preventive care visit? Physical exam Your health care provider will check:  Height and weight. These may be used to calculate body mass index (BMI), which is a measurement that tells if you are at a healthy weight.  Heart rate and blood pressure.  Your skin for abnormal spots. Counseling Your health care provider may ask you questions about:  Alcohol, tobacco, and drug use.  Emotional well-being.  Home and relationship well-being.  Sexual activity.  Eating habits.  History of falls.  Memory and ability to understand (cognition).  Work and work environment.  Pregnancy and menstrual history. What immunizations do I need?  Influenza (flu) vaccine  This is recommended every year. Tetanus, diphtheria, and pertussis (Tdap) vaccine  You may need a Td booster every 10 years. Varicella (chickenpox) vaccine  You may need this vaccine if you have not already been vaccinated. Zoster (shingles) vaccine  You may need this after age 60. Pneumococcal conjugate (PCV13) vaccine  One dose is recommended after age 67. Pneumococcal polysaccharide (PPSV23) vaccine  One dose is recommended after age 67. Measles, mumps, and rubella (MMR) vaccine  You may need at least one dose of MMR if you were born in 1957 or later. You may also need a second dose. Meningococcal conjugate (MenACWY) vaccine  You may need this if you have certain conditions. Hepatitis A vaccine  You may need this if you have certain conditions or if you travel or work in places where you may be exposed  to hepatitis A. Hepatitis B vaccine  You may need this if you have certain conditions or if you travel or work in places where you may be exposed to hepatitis B. Haemophilus influenzae type b (Hib) vaccine  You may need this if you have certain conditions. You may receive vaccines as individual doses or as more than one vaccine together in one shot (combination vaccines). Talk with your health care provider about the risks and benefits of combination vaccines. What tests do I need? Blood tests  Lipid and cholesterol levels. These may be checked every 5 years, or more frequently depending on your overall health.  Hepatitis C test.  Hepatitis B test. Screening  Lung cancer screening. You may have this screening every year starting at age 67 if you have a 30-pack-year history of smoking and currently smoke or have quit within the past 15 years.  Colorectal cancer screening. All adults should have this screening starting at age 67 and continuing until age 75. Your health care provider may recommend screening at age 45 if you are at increased risk. You will have tests every 1-10 years, depending on your results and the type of screening test.  Diabetes screening. This is done by checking your blood sugar (glucose) after you have not eaten for a while (fasting). You may have this done every 1-3 years.  Mammogram. This may be done every 1-2 years. Talk with your health care provider about how often you should have regular mammograms.  BRCA-related cancer screening. This may be done if you have a family history of breast, ovarian, tubal, or peritoneal cancers.   Other tests  Sexually transmitted disease (STD) testing.  Bone density scan. This is done to screen for osteoporosis. You may have this done starting at age 67. Follow these instructions at home: Eating and drinking  Eat a diet that includes fresh fruits and vegetables, whole grains, lean protein, and low-fat dairy products. Limit  your intake of foods with high amounts of sugar, saturated fats, and salt.  Take vitamin and mineral supplements as recommended by your health care provider.  Do not drink alcohol if your health care provider tells you not to drink.  If you drink alcohol: ? Limit how much you have to 0-1 drink a day. ? Be aware of how much alcohol is in your drink. In the [place] one drink equals one 12 oz bottle of beer (355 mL), one 5 oz glass of wine (148 mL), or one 1 oz glass of hard liquor (44 mL). Lifestyle  Take daily care of your teeth and gums.  Stay active. Exercise for at least 30 minutes on 5 or more days each week.  Do not use any products that contain nicotine or tobacco, such as cigarettes, e-cigarettes, and chewing tobacco. If you need help quitting, ask your health care provider.  If you are sexually active, practice safe sex. Use a condom or other form of protection in order to prevent STIs (sexually transmitted infections).  Talk with your health care provider about taking a low-dose aspirin or statin. What's next?  Go to your health care provider once a year for a well check visit.  Ask your health care provider how often you should have your eyes and teeth checked.  Stay up to date on all vaccines. This information is not intended to replace advice given to you by your health care provider. Make sure you discuss any questions you have with your health care provider. Document Revised: 03/27/2018 Document Reviewed: 03/27/2018 Elsevier Patient Education  2020 [employer]  

## 2020-03-03 ENCOUNTER — Encounter: Payer: Self-pay | Admitting: Physician Assistant

## 2020-03-03 DIAGNOSIS — I83812 Varicose veins of left lower extremities with pain: Secondary | ICD-10-CM | POA: Insufficient documentation

## 2020-03-03 DIAGNOSIS — Z6835 Body mass index (BMI) 35.0-35.9, adult: Secondary | ICD-10-CM | POA: Diagnosis not present

## 2020-03-03 DIAGNOSIS — I10 Essential (primary) hypertension: Secondary | ICD-10-CM | POA: Diagnosis not present

## 2020-03-03 DIAGNOSIS — F419 Anxiety disorder, unspecified: Secondary | ICD-10-CM | POA: Insufficient documentation

## 2020-03-03 DIAGNOSIS — M1990 Unspecified osteoarthritis, unspecified site: Secondary | ICD-10-CM | POA: Insufficient documentation

## 2020-03-03 DIAGNOSIS — Z0000 Encounter for general adult medical examination without abnormal findings: Secondary | ICD-10-CM | POA: Diagnosis not present

## 2020-03-03 DIAGNOSIS — B001 Herpesviral vesicular dermatitis: Secondary | ICD-10-CM | POA: Insufficient documentation

## 2020-03-04 LAB — LIPID PANEL
Chol/HDL Ratio: 3.9 ratio (ref 0.0–4.4)
Cholesterol, Total: 185 mg/dL (ref 100–199)
HDL: 48 mg/dL (ref >39)
LDL Chol Calc (NIH): 109 mg/dL — ABNORMAL HIGH (ref 0–99)
Triglycerides: 160 mg/dL — ABNORMAL HIGH (ref 0–149)
VLDL Cholesterol Cal: 28 mg/dL (ref 5–40)

## 2020-03-04 LAB — COMPREHENSIVE METABOLIC PANEL
ALT: 23 IU/L (ref 0–32)
AST: 19 IU/L (ref 0–40)
Albumin/Globulin Ratio: 1.8 (ref 1.2–2.2)
Albumin: 4 g/dL (ref 3.8–4.8)
Alkaline Phosphatase: 125 IU/L — ABNORMAL HIGH (ref 44–121)
BUN/Creatinine Ratio: 30 — ABNORMAL HIGH (ref 12–28)
BUN: 23 mg/dL (ref 8–27)
Bilirubin Total: <0.2 mg/dL (ref 0.0–1.2)
CO2: 21 mmol/L (ref 20–29)
Calcium: 9 mg/dL (ref 8.7–10.3)
Chloride: 104 mmol/L (ref 96–106)
Creatinine, Ser: 0.77 mg/dL (ref 0.57–1.00)
GFR calc Af Amer: 92 mL/min/{1.73_m2} (ref >59)
GFR calc non Af Amer: 80 mL/min/{1.73_m2} (ref >59)
Globulin, Total: 2.2 g/dL (ref 1.5–4.5)
Glucose: 91 mg/dL (ref 65–99)
Potassium: 3.8 mmol/L (ref 3.5–5.2)
Sodium: 141 mmol/L (ref 134–144)
Total Protein: 6.2 g/dL (ref 6.0–8.5)

## 2020-03-04 LAB — CBC WITH DIFFERENTIAL/PLATELET
Basophils Absolute: 0 10*3/uL (ref 0.0–0.2)
Basos: 1 %
EOS (ABSOLUTE): 0.1 10*3/uL (ref 0.0–0.4)
Eos: 2 %
Hematocrit: 39 % (ref 34.0–46.6)
Hemoglobin: 12.8 g/dL (ref 11.1–15.9)
Immature Grans (Abs): 0 10*3/uL (ref 0.0–0.1)
Immature Granulocytes: 0 %
Lymphocytes Absolute: 1.9 10*3/uL (ref 0.7–3.1)
Lymphs: 32 %
MCH: 32.3 pg (ref 26.6–33.0)
MCHC: 32.8 g/dL (ref 31.5–35.7)
MCV: 99 fL — ABNORMAL HIGH (ref 79–97)
Monocytes Absolute: 0.6 10*3/uL (ref 0.1–0.9)
Monocytes: 10 %
Neutrophils Absolute: 3.3 10*3/uL (ref 1.4–7.0)
Neutrophils: 55 %
Platelets: 274 10*3/uL (ref 150–450)
RBC: 3.96 x10E6/uL (ref 3.77–5.28)
RDW: 12.5 % (ref 11.7–15.4)
WBC: 5.9 10*3/uL (ref 3.4–10.8)

## 2020-03-04 LAB — HEMOGLOBIN A1C
Est. average glucose Bld gHb Est-mCnc: 117 mg/dL
Hgb A1c MFr Bld: 5.7 % — ABNORMAL HIGH (ref 4.8–5.6)

## 2020-03-04 LAB — TSH: TSH: 2.83 u[IU]/mL (ref 0.450–4.500)

## 2020-04-19 ENCOUNTER — Encounter (INDEPENDENT_AMBULATORY_CARE_PROVIDER_SITE_OTHER): Payer: Self-pay | Admitting: Vascular Surgery

## 2020-04-19 ENCOUNTER — Encounter (INDEPENDENT_AMBULATORY_CARE_PROVIDER_SITE_OTHER): Payer: Self-pay

## 2020-04-22 ENCOUNTER — Encounter: Payer: Self-pay | Admitting: Physician Assistant

## 2020-04-22 ENCOUNTER — Ambulatory Visit (INDEPENDENT_AMBULATORY_CARE_PROVIDER_SITE_OTHER): Payer: 59 | Admitting: Physician Assistant

## 2020-04-22 ENCOUNTER — Telehealth: Payer: Self-pay

## 2020-04-22 VITALS — Temp 99.4°F | Wt 191.0 lb

## 2020-04-22 DIAGNOSIS — R110 Nausea: Secondary | ICD-10-CM

## 2020-04-22 DIAGNOSIS — U071 COVID-19: Secondary | ICD-10-CM

## 2020-04-22 MED ORDER — ONDANSETRON 8 MG PO TBDP: 8.0000 mg | ORAL_TABLET | Freq: Three times a day (TID) | ORAL | 0 refills | Status: AC | PRN

## 2020-04-22 NOTE — Telephone Encounter (Signed)
Can we add her as a virtual appt this afternoon/evening please

## 2020-04-22 NOTE — Telephone Encounter (Signed)
Copied from [place] [id number]. Topic: General - Inquiry >> Apr 22, 2020 11:52 AM Gillis Ends D wrote: Reason for CRM: Patient was diagnosed with Covid and now is experiencing some nausea and vomiting. She would like to know if she cna get some nausea and vomiting medication called in for her. She can be reached at [phone]. Please advise

## 2020-04-22 NOTE — Telephone Encounter (Signed)
Patient scheduled.

## 2020-04-22 NOTE — Progress Notes (Unsigned)
Virtual telephone visit    Virtual Visit via Telephone Note   This visit type was conducted due to national recommendations for restrictions regarding the COVID-19 Pandemic (e.g. social distancing) in an effort to limit this patient's exposure and mitigate transmission in our community. Due to her co-morbid illnesses, this patient is at least at moderate risk for complications without adequate follow up. This format is felt to be most appropriate for this patient at this time. The patient did not have access to video technology or had technical difficulties with video requiring transitioning to audio format only (telephone). Physical exam was limited to content and character of the telephone converstion.    Patient location: Home Provider location: Home office in [place]  I discussed the limitations of evaluation and management by telemedicine and the availability of in person appointments. The patient expressed understanding and agreed to proceed.   Visit Date: 04/22/2020  Today's healthcare provider: Mar Daring, PA-C   No chief complaint on file.  Subjective    HPI  Patient with c/o fatigue, vomiting,diarrhea and nausea. Reports that she tested positive for COVID Saturday. She has cough and congestion and some chills. Reports has lost 10 pounds. She is taking IBU, Vitamin C, Zinc, D3 and is pushing fluids. Her O2 is 94% room air and temperature is 99.4  Wt Readings from Last 3 Encounters:  04/22/20 191 lb (86.6 kg)  03/02/20 206 lb 14.4 oz (93.8 kg)  07/07/19 203 lb 9.6 oz (92.4 kg)    Patient Active Problem List   Diagnosis Date Noted  . Arthritis 03/03/2020  . Varicose veins of left lower extremity with pain 03/03/2020  . Acute anxiety 03/03/2020  . Cold sore 03/03/2020  . Symptomatic mammary hypertrophy 07/07/2019  . Back pain 07/07/2019  . Shoulder pain 07/07/2019  . Facial rhytids 03/08/2015  . Obesity (BMI 30-39.9) 11/18/2011  . Essential  hypertension 11/16/2011  . Contact dermatitis 11/16/2011  . Benign colonic polyp 11/16/2011   Past Medical History:  Diagnosis Date  . Arthritis    fingers  . Chicken pox   . Dental bridge present    Permanent dental retainer - bottom front  . Diverticulosis   . GERD (gastroesophageal reflux disease)   . Headache(784.0)   . Hyperlipidemia   . Hypertension   . Vertigo    no episodes for over 10 years      Medications: Outpatient Medications Prior to Visit  Medication Sig  . ALPRAZolam (XANAX) 0.25 MG tablet Take 1 tablet (0.25 mg total) by mouth at bedtime as needed.  Marland Kitchen losartan (COZAAR) 100 MG tablet Take 1 tablet (100 mg total) by mouth daily.  Marland Kitchen triamterene-hydrochlorothiazide (DYAZIDE) 37.5-25 MG capsule Take 1 each (1 capsule total) by mouth daily.  . TURMERIC CURCUMIN PO Take by mouth daily.  . valACYclovir (VALTREX) 1000 MG tablet Take 1 tablet (1,000 mg total) by mouth 2 (two) times daily.  . potassium chloride SA (KLOR-CON M20) 20 MEQ tablet Take 1 tablet (20 mEq total) by mouth daily. (Patient not taking: Reported on 04/22/2020)   No facility-administered medications prior to visit.    Review of Systems  Constitutional: Positive for activity change and fatigue. Negative for fever.  HENT: Positive for congestion. Negative for postnasal drip, rhinorrhea, sinus pressure, sinus pain, sore throat and trouble swallowing.   Respiratory: Positive for cough. Negative for chest tightness, shortness of breath and wheezing.   Cardiovascular: Negative for chest pain.  Gastrointestinal: Positive for diarrhea, nausea and  vomiting.  Neurological: Negative for dizziness and headaches.    Last CBC Lab Results  Component Value Date   WBC 5.9 03/03/2020   HGB 12.8 03/03/2020   HCT 39.0 03/03/2020   MCV 99 (H) 03/03/2020   MCH 32.3 03/03/2020   RDW 12.5 03/03/2020   PLT 274 74/03/8785   Last metabolic panel Lab Results  Component Value Date   GLUCOSE 91 03/03/2020   NA 141  03/03/2020   K 3.8 03/03/2020   CL 104 03/03/2020   CO2 21 03/03/2020   BUN 23 03/03/2020   CREATININE 0.77 03/03/2020   GFRNONAA 80 03/03/2020   GFRAA 92 03/03/2020   CALCIUM 9.0 03/03/2020   PROT 6.2 03/03/2020   ALBUMIN 4.0 03/03/2020   LABGLOB 2.2 03/03/2020   AGRATIO 1.8 03/03/2020   BILITOT <0.2 03/03/2020   ALKPHOS 125 (H) 03/03/2020   AST 19 03/03/2020   ALT 23 03/03/2020      Objective    Temp 99.4 F (37.4 C) (Oral)   Wt 191 lb (86.6 kg)   SpO2 94%   BMI 32.79 kg/m  BP Readings from Last 3 Encounters:  03/02/20 140/90  07/07/19 137/87  04/01/19 (!) 154/95   Wt Readings from Last 3 Encounters:  04/22/20 191 lb (86.6 kg)  03/02/20 206 lb 14.4 oz (93.8 kg)  07/07/19 203 lb 9.6 oz (92.4 kg)        Assessment & Plan     1. COVID-19 Reports overall she is doing ok from covid 19. No significant respiratory symptoms. Main symptom is nausea.   2. Nausea Intense nausea limiting intake from covid 19. Will give Zofran as below. Continue to push fluids. Discussed adding pedialyte or gatorade for electrolyte replacement. BRAT/bland diet. Increase diet as tolerated. Call if worsening.  - ondansetron (ZOFRAN ODT) 8 MG disintegrating tablet; Take 1 tablet (8 mg total) by mouth every 8 (eight) hours as needed for nausea or vomiting.  Dispense: 20 tablet; Refill: 0   No follow-ups on file.    I discussed the assessment and treatment plan with the patient. The patient was provided an opportunity to ask questions and all were answered. The patient agreed with the plan and demonstrated an understanding of the instructions.   The patient was advised to call back or seek an in-person evaluation if the symptoms worsen or if the condition fails to improve as anticipated.  I provided 13 minutes of non-face-to-face time during this encounter.  Reynolds Bowl, PA-C, have reviewed all documentation for this visit. The documentation on 04/23/20 for the exam, diagnosis,  procedures, and orders are all accurate and complete.  [place] [facility] [phone] (phone) [phone] (fax)  [place]

## 2020-04-23 ENCOUNTER — Encounter: Payer: Self-pay | Admitting: Physician Assistant

## 2020-04-24 ENCOUNTER — Other Ambulatory Visit: Payer: Self-pay

## 2020-04-24 ENCOUNTER — Emergency Department
Admission: EM | Admit: 2020-04-24 | Discharge: 2020-04-24 | Disposition: A | Discharge status: Home / Self Care | Payer: 59 | Attending: Emergency Medicine | Admitting: Emergency Medicine

## 2020-04-24 DIAGNOSIS — U071 COVID-19: Secondary | ICD-10-CM | POA: Insufficient documentation

## 2020-04-24 DIAGNOSIS — E860 Dehydration: Secondary | ICD-10-CM | POA: Diagnosis not present

## 2020-04-24 DIAGNOSIS — Z79899 Other long term (current) drug therapy: Secondary | ICD-10-CM | POA: Diagnosis not present

## 2020-04-24 DIAGNOSIS — R112 Nausea with vomiting, unspecified: Secondary | ICD-10-CM | POA: Diagnosis present

## 2020-04-24 DIAGNOSIS — I10 Essential (primary) hypertension: Secondary | ICD-10-CM | POA: Insufficient documentation

## 2020-04-24 LAB — CBC
HCT: 39.9 % (ref 36.0–46.0)
Hemoglobin: 13.6 g/dL (ref 12.0–15.0)
MCH: 31.6 pg (ref 26.0–34.0)
MCHC: 34.1 g/dL (ref 30.0–36.0)
MCV: 92.6 fL (ref 80.0–100.0)
Platelets: 189 10*3/uL (ref 150–400)
RBC: 4.31 MIL/uL (ref 3.87–5.11)
RDW: 12.7 % (ref 11.5–15.5)
WBC: 6.6 10*3/uL (ref 4.0–10.5)
nRBC: 0 % (ref 0.0–0.2)

## 2020-04-24 LAB — BASIC METABOLIC PANEL
Anion gap: 17 — ABNORMAL HIGH (ref 5–15)
BUN: 26 mg/dL — ABNORMAL HIGH (ref 8–23)
CO2: 23 mmol/L (ref 22–32)
Calcium: 8.9 mg/dL (ref 8.9–10.3)
Chloride: 91 mmol/L — ABNORMAL LOW (ref 98–111)
Creatinine, Ser: 1.25 mg/dL — ABNORMAL HIGH (ref 0.44–1.00)
GFR, Estimated: 47 mL/min — ABNORMAL LOW (ref >60)
Glucose, Bld: 127 mg/dL — ABNORMAL HIGH (ref 70–99)
Potassium: 2.9 mmol/L — ABNORMAL LOW (ref 3.5–5.1)
Sodium: 131 mmol/L — ABNORMAL LOW (ref 135–145)

## 2020-04-24 MED ORDER — ONDANSETRON HCL 4 MG/2ML IJ SOLN
4.0000 mg | Freq: Once | INTRAMUSCULAR | Status: AC
  Administered 2020-04-24: 4 mg via INTRAVENOUS
  Filled 2020-04-24: qty 2

## 2020-04-24 MED ORDER — SODIUM CHLORIDE 0.9 % IV SOLN
1000.0000 mL | Freq: Once | INTRAVENOUS | Status: AC
  Administered 2020-04-24: 1000 mL via INTRAVENOUS

## 2020-04-24 NOTE — ED Triage Notes (Signed)
Patient reports tested positive for COVID a week ago this past Saturday.  Reports having nausea and vomiting.  Reports PMD prescribed nausea medication but that has not helped.  Patient reports feeling weak.

## 2020-04-24 NOTE — ED Provider Notes (Addendum)
[facility] Emergency Department Provider Note   ____________________________________________    I have reviewed the triage vital signs and the nursing notes.   HISTORY  Chief Complaint Nausea and Weakness     HPI Christina Conway is a 70 y.o. female who reports that she tested positive for COVID-19 last week, she is not complaining of any chest pain shortness of breath but does report significant nausea and vomiting.  She has had diarrhea as well.  No abdominal pain.  Has taken ODT Zofran with little improvement.  Is here because she feels dehydrated.  Past Medical History:  Diagnosis Date  . Arthritis    fingers  . Chicken pox   . Dental bridge present    Permanent dental retainer - bottom front  . Diverticulosis   . GERD (gastroesophageal reflux disease)   . Headache(784.0)   . Hyperlipidemia   . Hypertension   . Vertigo    no episodes for over 10 years    Patient Active Problem List   Diagnosis Date Noted  . Arthritis 03/03/2020  . Varicose veins of left lower extremity with pain 03/03/2020  . Acute anxiety 03/03/2020  . Cold sore 03/03/2020  . Symptomatic mammary hypertrophy 07/07/2019  . Back pain 07/07/2019  . Shoulder pain 07/07/2019  . Facial rhytids 03/08/2015  . Obesity (BMI 30-39.9) 11/18/2011  . Essential hypertension 11/16/2011  . Contact dermatitis 11/16/2011  . Benign colonic polyp 11/16/2011    Past Surgical History:  Procedure Laterality Date  . ABDOMINAL HYSTERECTOMY  08/02/1997  . bunion    . BUNIONECTOMY  06/03/1998 & 06/04/2003  . CATARACT EXTRACTION W/PHACO Right 03/04/2019   Procedure: CATARACT EXTRACTION PHACO AND INTRAOCULAR LENS PLACEMENT (IOC) RIGHT  3.88  00:33.8  11.4%;  Surgeon: Leandrew Koyanagi, MD;  Location: [place];  Service: Ophthalmology;  Laterality: Right;  . CATARACT EXTRACTION W/PHACO Left 04/01/2019   Procedure: CATARACT EXTRACTION PHACO AND INTRAOCULAR LENS PLACEMENT (IOC)  LEFT  3.59  00:28.2  12.7%;  Surgeon: Leandrew Koyanagi, MD;  Location: [place];  Service: Ophthalmology;  Laterality: Left;  . HAND SURGERY  09/01/2002   righ hand    Prior to Admission medications   Medication Sig Start Date End Date Taking? Authorizing Provider  ALPRAZolam (XANAX) 0.25 MG tablet Take 1 tablet (0.25 mg total) by mouth at bedtime as needed. 03/02/20   Mar Daring, PA-C  losartan (COZAAR) 100 MG tablet Take 1 tablet (100 mg total) by mouth daily. 03/02/20   Mar Daring, PA-C  ondansetron (ZOFRAN ODT) 8 MG disintegrating tablet Take 1 tablet (8 mg total) by mouth every 8 (eight) hours as needed for nausea or vomiting. 04/22/20   Mar Daring, PA-C  potassium chloride SA (KLOR-CON M20) 20 MEQ tablet Take 1 tablet (20 mEq total) by mouth daily. Patient not taking: Reported on 04/22/2020 03/02/20   Mar Daring, PA-C  triamterene-hydrochlorothiazide (DYAZIDE) 37.5-25 MG capsule Take 1 each (1 capsule total) by mouth daily. 03/02/20   Mar Daring, PA-C  TURMERIC CURCUMIN PO Take by mouth daily.    [provider]  valACYclovir (VALTREX) 1000 MG tablet Take 1 tablet (1,000 mg total) by mouth 2 (two) times daily. 03/02/20   Mar Daring, PA-C     Allergies Penicillins  Family History  Problem Relation Age of Onset  . Arthritis Mother   . Heart disease Mother   . Hypertension Mother   . Heart murmur Mother   .  Arthritis Father   . Heart disease Father   . Hypertension Father   . Heart murmur Father   . Breast cancer Neg Hx     Social History Social History   Tobacco Use  . Smoking status: Never Smoker  . Smokeless tobacco: Never Used  Vaping Use  . Vaping Use: Never used  Substance Use Topics  . Alcohol use: Yes    Comment: 1-2 a month  . Drug use: No    Review of Systems  Constitutional: Chills Eyes: No visual changes.  ENT: No sore throat. Cardiovascular: Denies chest  pain. Respiratory: Denies shortness of breath. Gastrointestinal: As above Genitourinary: Negative for dysuria. Musculoskeletal: Mild Skin: Negative for rash. Neurological: Negative for headaches or weakness   ____________________________________________   PHYSICAL EXAM:  VITAL SIGNS: ED Triage Vitals [04/24/20 1922]  Enc Vitals Group     BP 120/76     Pulse Rate 95     Resp 18     Temp (!) 100.9 F (38.3 C)     Temp Source Oral     SpO2 97 %     Weight 86.2 kg (190 lb)     Height 1.626 m (5\' 4" )     Head Circumference      Peak Flow      Pain Score 0     Pain Loc      Pain Edu?      Excl. in [place]?     Constitutional: Alert and oriented. No acute distress. Pleasant and interactive  Nose: No congestion/rhinnorhea. Mouth/Throat: Mucous membranes are moist.    Cardiovascular: Normal rate, regular rhythm. Grossly normal heart sounds.  Good peripheral circulation. Respiratory: Normal respiratory effort.  No retractions. Lungs CTAB. Gastrointestinal: Soft and nontender. No distention.  No CVA tenderness.  Musculoskeletal: No lower extremity tenderness nor edema.  Warm and well perfused Neurologic:  Normal speech and language. No gross focal neurologic deficits are appreciated.  Skin:  Skin is warm, dry and intact. No rash noted. Psychiatric: Mood and affect are normal. Speech and behavior are normal.  ____________________________________________   LABS (all labs ordered are listed, but only abnormal results are displayed)  Labs Reviewed  BASIC METABOLIC PANEL - Abnormal; Notable for the following components:      Result Value   Sodium 131 (*)    Potassium 2.9 (*)    Chloride 91 (*)    Glucose, Bld 127 (*)    BUN 26 (*)    Creatinine, Ser 1.25 (*)    GFR, Estimated 47 (*)    Anion gap 17 (*)    All other components within normal limits  CBC  URINALYSIS, COMPLETE (UACMP) WITH MICROSCOPIC   ____________________________________________  EKG  ED ECG  REPORT I, Lavonia Drafts, the attending physician, personally viewed and interpreted this ECG.  Date: 04/24/2020  Rhythm: normal sinus rhythm QRS Axis: normal Intervals: normal ST/T Wave abnormalities: normal Narrative Interpretation: no evidence of acute ischemia  ____________________________________________  RADIOLOGY  None ____________________________________________   PROCEDURES  Procedure(s) performed: No  Procedures   Critical Care performed: No ____________________________________________   INITIAL IMPRESSION / ASSESSMENT AND PLAN / ED COURSE  Pertinent labs & imaging results that were available during my care of the patient were reviewed by me and considered in my medical decision making (see chart for details).  Patient with known COVID-19 presents with complaints of nausea vomiting diarrhea and a sense of feeling dehydrated  Her lab work is consistent with dehydration, mild hypokalemia, hyponatremia,  with COVID-19 and also nausea vomiting diarrhea.  Mild elevation of BUN and creatinine, patient treated with IV fluids, IV Zofran with improvement  No indication for admission at this time, appropriate for discharge after IV fluids   ____________________________________________   FINAL CLINICAL IMPRESSION(S) / ED DIAGNOSES  Final diagnoses:  COVID-19  Dehydration        Note:  This document was prepared using Dragon voice recognition software and may include unintentional dictation errors.   Lavonia Drafts, MD 04/24/20 Linward Foster    Lavonia Drafts, MD 04/24/20 [id number]

## 2020-04-25 ENCOUNTER — Telehealth: Payer: Self-pay

## 2020-04-25 NOTE — Telephone Encounter (Signed)
Copied from [place] [phone]. Topic: General - Other >> Apr 25, 2020 11:25 AM Tessa Lerner A wrote: Reason for CRM: Patient wanted PCP notified that she was given a Liter of IV fluid with a vial of zopfran added to it at [facility] on 04/24/20 8:58PM and patient would also like to discuss persistent cough

## 2020-04-26 NOTE — Telephone Encounter (Signed)
I did see the ER note.  She can do another virtual to discuss progression or symptoms.

## 2020-04-26 NOTE — Telephone Encounter (Signed)
Virtual visit 6pm 04/27/2020  Thanks,   -Mickel Baas

## 2020-04-27 ENCOUNTER — Telehealth (INDEPENDENT_AMBULATORY_CARE_PROVIDER_SITE_OTHER): Payer: 59 | Admitting: Physician Assistant

## 2020-04-27 ENCOUNTER — Encounter: Payer: Self-pay | Admitting: Physician Assistant

## 2020-04-27 DIAGNOSIS — U071 COVID-19: Secondary | ICD-10-CM

## 2020-04-27 MED ORDER — HYDROCOD POLST-CPM POLST ER 10-8 MG/5ML PO SUER: 5.0000 mL | Freq: Two times a day (BID) | ORAL | 0 refills | Status: AC | PRN

## 2020-04-27 NOTE — Progress Notes (Unsigned)
MyChart Video Visit    Virtual Visit via Video Note   This visit type was conducted due to national recommendations for restrictions regarding the COVID-19 Pandemic (e.g. social distancing) in an effort to limit this patient's exposure and mitigate transmission in our community. This patient is at least at moderate risk for complications without adequate follow up. This format is felt to be most appropriate for this patient at this time. Physical exam was limited by quality of the video and audio technology used for the visit.   Patient location: Home Provider location: [facility]  I discussed the limitations of evaluation and management by telemedicine and the availability of in person appointments. The patient expressed understanding and agreed to proceed.  Patient: Christina Conway   DOB: Dec 28, 1950   70 y.o. Female  MRN: [id number] Visit Date: 04/27/2020  Today's healthcare provider: Mar Daring, PA-C   Chief Complaint  Patient presents with  . Cough   Subjective    HPI  Patient with covid-19 reports that cough is worsening. She was seen at [facility] ER and was given IV nausea medications and fluids and that helped her tremendously. Still having more progressive cough at this time. Denies any SOB or difficulty breathing. Home O2 readings are staying around 93%.  Of note: husband is currently hospitalized with covid 19 in ICU, but she does report he is improving and they are planning to move him to stepdown once a bed is available.  Patient Active Problem List   Diagnosis Date Noted  . Arthritis 03/03/2020  . Varicose veins of left lower extremity with pain 03/03/2020  . Acute anxiety 03/03/2020  . Cold sore 03/03/2020  . Symptomatic mammary hypertrophy 07/07/2019  . Back pain 07/07/2019  . Shoulder pain 07/07/2019  . Facial rhytids 03/08/2015  . Obesity (BMI 30-39.9) 11/18/2011  . Essential hypertension 11/16/2011  . Contact dermatitis 11/16/2011  .  Benign colonic polyp 11/16/2011   Past Medical History:  Diagnosis Date  . Arthritis    fingers  . Chicken pox   . Dental bridge present    Permanent dental retainer - bottom front  . Diverticulosis   . GERD (gastroesophageal reflux disease)   . Headache(784.0)   . Hyperlipidemia   . Hypertension   . Vertigo    no episodes for over 10 years      Medications: Outpatient Medications Prior to Visit  Medication Sig  . ALPRAZolam (XANAX) 0.25 MG tablet Take 1 tablet (0.25 mg total) by mouth at bedtime as needed.  Marland Kitchen losartan (COZAAR) 100 MG tablet Take 1 tablet (100 mg total) by mouth daily.  . ondansetron (ZOFRAN ODT) 8 MG disintegrating tablet Take 1 tablet (8 mg total) by mouth every 8 (eight) hours as needed for nausea or vomiting.  . triamterene-hydrochlorothiazide (DYAZIDE) 37.5-25 MG capsule Take 1 each (1 capsule total) by mouth daily.  . valACYclovir (VALTREX) 1000 MG tablet Take 1 tablet (1,000 mg total) by mouth 2 (two) times daily.  . potassium chloride SA (KLOR-CON M20) 20 MEQ tablet Take 1 tablet (20 mEq total) by mouth daily.  . TURMERIC CURCUMIN PO Take by mouth daily.   No facility-administered medications prior to visit.    Review of Systems  @AMBLABREVIEWLINK @  Objective    SpO2 93%  BP Readings from Last 3 Encounters:  04/24/20 132/68  03/02/20 140/90  07/07/19 137/87   Wt Readings from Last 3 Encounters:  04/24/20 190 lb (86.2 kg)  04/22/20 191 lb (86.6  kg)  03/02/20 206 lb 14.4 oz (93.8 kg)      Physical Exam Vitals reviewed.  Constitutional:      Appearance: She is well-developed and well-nourished.  HENT:     Head: Normocephalic and atraumatic.  Eyes:     Extraocular Movements: EOM normal.  Pulmonary:     Effort: Pulmonary effort is normal. No respiratory distress (dry, barking cough heard a few times, but no SOB or difficulty breathing. Able to speak in full sentences without issue).  Musculoskeletal:     Cervical back: Normal range of  motion and neck supple.  Psychiatric:        Mood and Affect: Mood and affect and mood normal.        Behavior: Behavior normal.        Thought Content: Thought content normal.        Judgment: Judgment normal.       Assessment & Plan     1. COVID-19 Patient improving with residual cough. Tussionex cough given as below. Push fluids. Call if worsening or symptoms change.  - chlorpheniramine-HYDROcodone (TUSSIONEX PENNKINETIC ER) 10-8 MG/5ML SUER; Take 5 mLs by mouth every 12 (twelve) hours as needed for cough.  Dispense: 140 mL; Refill: 0   No follow-ups on file.     I discussed the assessment and treatment plan with the patient. The patient was provided an opportunity to ask questions and all were answered. The patient agreed with the plan and demonstrated an understanding of the instructions.   The patient was advised to call back or seek an in-person evaluation if the symptoms worsen or if the condition fails to improve as anticipated.  I provided 11 minutes of face-to-face time during this encounter via MyChart Video enabled encounter.  Christina Bowl, PA-C, have reviewed all documentation for this visit. The documentation on 04/28/20 for the exam, diagnosis, procedures, and orders are all accurate and complete.  Christina Conway [facility] [phone] (phone) [phone] (fax)  [place]

## 2020-04-28 ENCOUNTER — Encounter: Payer: Self-pay | Admitting: Physician Assistant

## 2020-05-02 ENCOUNTER — Telehealth: Payer: 59 | Admitting: Physician Assistant

## 2020-05-04 DIAGNOSIS — M79672 Pain in left foot: Secondary | ICD-10-CM | POA: Diagnosis not present

## 2020-05-06 DIAGNOSIS — L570 Actinic keratosis: Secondary | ICD-10-CM | POA: Diagnosis not present

## 2020-05-06 DIAGNOSIS — D485 Neoplasm of uncertain behavior of skin: Secondary | ICD-10-CM | POA: Diagnosis not present

## 2020-05-06 DIAGNOSIS — L400 Psoriasis vulgaris: Secondary | ICD-10-CM | POA: Diagnosis not present

## 2020-05-06 DIAGNOSIS — D2262 Melanocytic nevi of left upper limb, including shoulder: Secondary | ICD-10-CM | POA: Diagnosis not present

## 2020-05-06 DIAGNOSIS — D2261 Melanocytic nevi of right upper limb, including shoulder: Secondary | ICD-10-CM | POA: Diagnosis not present

## 2020-05-06 DIAGNOSIS — D2272 Melanocytic nevi of left lower limb, including hip: Secondary | ICD-10-CM | POA: Diagnosis not present

## 2020-05-06 DIAGNOSIS — Z85828 Personal history of other malignant neoplasm of skin: Secondary | ICD-10-CM | POA: Diagnosis not present

## 2020-05-06 DIAGNOSIS — D225 Melanocytic nevi of trunk: Secondary | ICD-10-CM | POA: Diagnosis not present

## 2020-05-06 DIAGNOSIS — L814 Other melanin hyperpigmentation: Secondary | ICD-10-CM | POA: Diagnosis not present

## 2020-05-13 DIAGNOSIS — L570 Actinic keratosis: Secondary | ICD-10-CM | POA: Diagnosis not present

## 2020-05-14 ENCOUNTER — Other Ambulatory Visit: Payer: Self-pay | Admitting: Physician Assistant

## 2020-05-14 DIAGNOSIS — F419 Anxiety disorder, unspecified: Secondary | ICD-10-CM

## 2020-05-14 NOTE — Telephone Encounter (Signed)
Requested medications are due for refill today.   yes  Requested medications are on the active medications list.  yes  Last refill.03/02/2020  Future visit scheduled.  Yes  Notes to clinic.  Not delegated

## 2020-06-24 ENCOUNTER — Other Ambulatory Visit: Payer: Self-pay | Admitting: Physician Assistant

## 2020-06-24 DIAGNOSIS — I10 Essential (primary) hypertension: Secondary | ICD-10-CM

## 2020-06-24 NOTE — Telephone Encounter (Signed)
No future visit at this time  

## 2020-09-19 ENCOUNTER — Telehealth: Payer: Self-pay

## 2020-09-19 DIAGNOSIS — Z1231 Encounter for screening mammogram for malignant neoplasm of breast: Secondary | ICD-10-CM

## 2020-09-19 NOTE — Telephone Encounter (Signed)
Have signed order. She can call [place] to schedule, isn't due til [place]

## 2020-09-19 NOTE — Addendum Note (Signed)
Addended by: Ashley Royalty E on: 09/19/2020 01:15 PM   Modules accepted: Orders

## 2020-09-19 NOTE — Telephone Encounter (Signed)
Copied from [place] [phone]. Topic: General - Other >> Sep 19, 2020 11:41 AM Pawlus, Brayton Layman A wrote: Reason for CRM: Pt needs orders for a mammogram sent in to [facility] breast center. Please advise.

## 2020-09-19 NOTE — Telephone Encounter (Signed)
Is this okay to order?  Thanks,   -Mickel Baas

## 2020-09-19 NOTE — Telephone Encounter (Signed)
Pt advised.   Thanks,   -Sharday Michl  

## 2020-09-19 NOTE — Addendum Note (Signed)
Addended by: Birdie Sons on: 09/19/2020 02:41 PM   Modules accepted: Orders

## 2020-11-03 ENCOUNTER — Other Ambulatory Visit: Payer: Self-pay

## 2020-11-03 ENCOUNTER — Ambulatory Visit
Admission: RE | Admit: 2020-11-03 | Discharge: 2020-11-03 | Disposition: A | Discharge status: Home / Self Care | Payer: Medicare HMO | Source: Ambulatory Visit | Attending: Family Medicine | Admitting: Family Medicine

## 2020-11-03 DIAGNOSIS — Z1231 Encounter for screening mammogram for malignant neoplasm of breast: Secondary | ICD-10-CM | POA: Insufficient documentation

## 2020-11-24 DIAGNOSIS — Z961 Presence of intraocular lens: Secondary | ICD-10-CM | POA: Diagnosis not present

## 2020-12-14 DIAGNOSIS — L570 Actinic keratosis: Secondary | ICD-10-CM | POA: Diagnosis not present

## 2020-12-14 DIAGNOSIS — D485 Neoplasm of uncertain behavior of skin: Secondary | ICD-10-CM | POA: Diagnosis not present

## 2020-12-14 DIAGNOSIS — D0471 Carcinoma in situ of skin of right lower limb, including hip: Secondary | ICD-10-CM | POA: Diagnosis not present

## 2020-12-14 DIAGNOSIS — X32XXXA Exposure to sunlight, initial encounter: Secondary | ICD-10-CM | POA: Diagnosis not present

## 2020-12-16 ENCOUNTER — Other Ambulatory Visit: Payer: Self-pay | Admitting: Physician Assistant

## 2020-12-16 DIAGNOSIS — I10 Essential (primary) hypertension: Secondary | ICD-10-CM

## 2020-12-16 DIAGNOSIS — F419 Anxiety disorder, unspecified: Secondary | ICD-10-CM

## 2020-12-21 NOTE — Telephone Encounter (Signed)
LOV 03/02/2020   NOV: 04/27/2021  Last Refill:  Alprazolam 05/16/2020 #30 with 5 refills. Triam/HCTZ  03/02/2020 #90 with 3 refills.

## 2021-01-19 DIAGNOSIS — D0471 Carcinoma in situ of skin of right lower limb, including hip: Secondary | ICD-10-CM | POA: Diagnosis not present

## 2021-02-27 DIAGNOSIS — M5416 Radiculopathy, lumbar region: Secondary | ICD-10-CM | POA: Diagnosis not present

## 2021-02-27 DIAGNOSIS — M9903 Segmental and somatic dysfunction of lumbar region: Secondary | ICD-10-CM | POA: Diagnosis not present

## 2021-02-27 DIAGNOSIS — M9901 Segmental and somatic dysfunction of cervical region: Secondary | ICD-10-CM | POA: Diagnosis not present

## 2021-02-27 DIAGNOSIS — M542 Cervicalgia: Secondary | ICD-10-CM | POA: Diagnosis not present

## 2021-02-28 DIAGNOSIS — M5416 Radiculopathy, lumbar region: Secondary | ICD-10-CM | POA: Diagnosis not present

## 2021-02-28 DIAGNOSIS — M542 Cervicalgia: Secondary | ICD-10-CM | POA: Diagnosis not present

## 2021-02-28 DIAGNOSIS — M9901 Segmental and somatic dysfunction of cervical region: Secondary | ICD-10-CM | POA: Diagnosis not present

## 2021-02-28 DIAGNOSIS — M9903 Segmental and somatic dysfunction of lumbar region: Secondary | ICD-10-CM | POA: Diagnosis not present

## 2021-03-02 DIAGNOSIS — M9903 Segmental and somatic dysfunction of lumbar region: Secondary | ICD-10-CM | POA: Diagnosis not present

## 2021-03-02 DIAGNOSIS — M9901 Segmental and somatic dysfunction of cervical region: Secondary | ICD-10-CM | POA: Diagnosis not present

## 2021-03-02 DIAGNOSIS — M542 Cervicalgia: Secondary | ICD-10-CM | POA: Diagnosis not present

## 2021-03-02 DIAGNOSIS — M5416 Radiculopathy, lumbar region: Secondary | ICD-10-CM | POA: Diagnosis not present

## 2021-03-03 ENCOUNTER — Ambulatory Visit: Payer: 59 | Admitting: Physician Assistant

## 2021-03-06 DIAGNOSIS — M9903 Segmental and somatic dysfunction of lumbar region: Secondary | ICD-10-CM | POA: Diagnosis not present

## 2021-03-06 DIAGNOSIS — M542 Cervicalgia: Secondary | ICD-10-CM | POA: Diagnosis not present

## 2021-03-06 DIAGNOSIS — M9901 Segmental and somatic dysfunction of cervical region: Secondary | ICD-10-CM | POA: Diagnosis not present

## 2021-03-06 DIAGNOSIS — M5416 Radiculopathy, lumbar region: Secondary | ICD-10-CM | POA: Diagnosis not present

## 2021-03-08 DIAGNOSIS — M9901 Segmental and somatic dysfunction of cervical region: Secondary | ICD-10-CM | POA: Diagnosis not present

## 2021-03-08 DIAGNOSIS — M542 Cervicalgia: Secondary | ICD-10-CM | POA: Diagnosis not present

## 2021-03-08 DIAGNOSIS — M5416 Radiculopathy, lumbar region: Secondary | ICD-10-CM | POA: Diagnosis not present

## 2021-03-08 DIAGNOSIS — M9903 Segmental and somatic dysfunction of lumbar region: Secondary | ICD-10-CM | POA: Diagnosis not present

## 2021-03-14 ENCOUNTER — Other Ambulatory Visit: Payer: Self-pay | Admitting: Physician Assistant

## 2021-03-14 DIAGNOSIS — I10 Essential (primary) hypertension: Secondary | ICD-10-CM

## 2021-03-14 DIAGNOSIS — M542 Cervicalgia: Secondary | ICD-10-CM | POA: Diagnosis not present

## 2021-03-14 DIAGNOSIS — M9901 Segmental and somatic dysfunction of cervical region: Secondary | ICD-10-CM | POA: Diagnosis not present

## 2021-03-14 DIAGNOSIS — M9903 Segmental and somatic dysfunction of lumbar region: Secondary | ICD-10-CM | POA: Diagnosis not present

## 2021-03-14 DIAGNOSIS — M5416 Radiculopathy, lumbar region: Secondary | ICD-10-CM | POA: Diagnosis not present

## 2021-03-15 DIAGNOSIS — M9903 Segmental and somatic dysfunction of lumbar region: Secondary | ICD-10-CM | POA: Diagnosis not present

## 2021-03-15 DIAGNOSIS — M5416 Radiculopathy, lumbar region: Secondary | ICD-10-CM | POA: Diagnosis not present

## 2021-03-15 DIAGNOSIS — M9901 Segmental and somatic dysfunction of cervical region: Secondary | ICD-10-CM | POA: Diagnosis not present

## 2021-03-15 DIAGNOSIS — M542 Cervicalgia: Secondary | ICD-10-CM | POA: Diagnosis not present

## 2021-03-17 DIAGNOSIS — M9901 Segmental and somatic dysfunction of cervical region: Secondary | ICD-10-CM | POA: Diagnosis not present

## 2021-03-17 DIAGNOSIS — M5416 Radiculopathy, lumbar region: Secondary | ICD-10-CM | POA: Diagnosis not present

## 2021-03-17 DIAGNOSIS — M542 Cervicalgia: Secondary | ICD-10-CM | POA: Diagnosis not present

## 2021-03-17 DIAGNOSIS — M9903 Segmental and somatic dysfunction of lumbar region: Secondary | ICD-10-CM | POA: Diagnosis not present

## 2021-03-21 DIAGNOSIS — M5416 Radiculopathy, lumbar region: Secondary | ICD-10-CM | POA: Diagnosis not present

## 2021-03-21 DIAGNOSIS — M542 Cervicalgia: Secondary | ICD-10-CM | POA: Diagnosis not present

## 2021-03-21 DIAGNOSIS — M9901 Segmental and somatic dysfunction of cervical region: Secondary | ICD-10-CM | POA: Diagnosis not present

## 2021-03-21 DIAGNOSIS — M9903 Segmental and somatic dysfunction of lumbar region: Secondary | ICD-10-CM | POA: Diagnosis not present

## 2021-03-22 DIAGNOSIS — M9903 Segmental and somatic dysfunction of lumbar region: Secondary | ICD-10-CM | POA: Diagnosis not present

## 2021-03-22 DIAGNOSIS — M542 Cervicalgia: Secondary | ICD-10-CM | POA: Diagnosis not present

## 2021-03-22 DIAGNOSIS — M5416 Radiculopathy, lumbar region: Secondary | ICD-10-CM | POA: Diagnosis not present

## 2021-03-22 DIAGNOSIS — M9901 Segmental and somatic dysfunction of cervical region: Secondary | ICD-10-CM | POA: Diagnosis not present

## 2021-03-24 DIAGNOSIS — M5416 Radiculopathy, lumbar region: Secondary | ICD-10-CM | POA: Diagnosis not present

## 2021-03-24 DIAGNOSIS — M9903 Segmental and somatic dysfunction of lumbar region: Secondary | ICD-10-CM | POA: Diagnosis not present

## 2021-03-24 DIAGNOSIS — M9901 Segmental and somatic dysfunction of cervical region: Secondary | ICD-10-CM | POA: Diagnosis not present

## 2021-03-24 DIAGNOSIS — M542 Cervicalgia: Secondary | ICD-10-CM | POA: Diagnosis not present

## 2021-03-28 DIAGNOSIS — M9903 Segmental and somatic dysfunction of lumbar region: Secondary | ICD-10-CM | POA: Diagnosis not present

## 2021-03-28 DIAGNOSIS — M542 Cervicalgia: Secondary | ICD-10-CM | POA: Diagnosis not present

## 2021-03-28 DIAGNOSIS — M5416 Radiculopathy, lumbar region: Secondary | ICD-10-CM | POA: Diagnosis not present

## 2021-03-28 DIAGNOSIS — M9901 Segmental and somatic dysfunction of cervical region: Secondary | ICD-10-CM | POA: Diagnosis not present

## 2021-03-30 DIAGNOSIS — M542 Cervicalgia: Secondary | ICD-10-CM | POA: Diagnosis not present

## 2021-03-30 DIAGNOSIS — M5416 Radiculopathy, lumbar region: Secondary | ICD-10-CM | POA: Diagnosis not present

## 2021-03-30 DIAGNOSIS — M9903 Segmental and somatic dysfunction of lumbar region: Secondary | ICD-10-CM | POA: Diagnosis not present

## 2021-03-30 DIAGNOSIS — M9901 Segmental and somatic dysfunction of cervical region: Secondary | ICD-10-CM | POA: Diagnosis not present

## 2021-04-04 DIAGNOSIS — M9901 Segmental and somatic dysfunction of cervical region: Secondary | ICD-10-CM | POA: Diagnosis not present

## 2021-04-04 DIAGNOSIS — M9903 Segmental and somatic dysfunction of lumbar region: Secondary | ICD-10-CM | POA: Diagnosis not present

## 2021-04-04 DIAGNOSIS — M5416 Radiculopathy, lumbar region: Secondary | ICD-10-CM | POA: Diagnosis not present

## 2021-04-04 DIAGNOSIS — M542 Cervicalgia: Secondary | ICD-10-CM | POA: Diagnosis not present

## 2021-04-06 DIAGNOSIS — M9901 Segmental and somatic dysfunction of cervical region: Secondary | ICD-10-CM | POA: Diagnosis not present

## 2021-04-06 DIAGNOSIS — M542 Cervicalgia: Secondary | ICD-10-CM | POA: Diagnosis not present

## 2021-04-06 DIAGNOSIS — M5416 Radiculopathy, lumbar region: Secondary | ICD-10-CM | POA: Diagnosis not present

## 2021-04-06 DIAGNOSIS — M9903 Segmental and somatic dysfunction of lumbar region: Secondary | ICD-10-CM | POA: Diagnosis not present

## 2021-04-11 DIAGNOSIS — M9901 Segmental and somatic dysfunction of cervical region: Secondary | ICD-10-CM | POA: Diagnosis not present

## 2021-04-11 DIAGNOSIS — M5416 Radiculopathy, lumbar region: Secondary | ICD-10-CM | POA: Diagnosis not present

## 2021-04-11 DIAGNOSIS — M542 Cervicalgia: Secondary | ICD-10-CM | POA: Diagnosis not present

## 2021-04-11 DIAGNOSIS — M9903 Segmental and somatic dysfunction of lumbar region: Secondary | ICD-10-CM | POA: Diagnosis not present

## 2021-04-13 DIAGNOSIS — M9903 Segmental and somatic dysfunction of lumbar region: Secondary | ICD-10-CM | POA: Diagnosis not present

## 2021-04-13 DIAGNOSIS — M5416 Radiculopathy, lumbar region: Secondary | ICD-10-CM | POA: Diagnosis not present

## 2021-04-13 DIAGNOSIS — M9901 Segmental and somatic dysfunction of cervical region: Secondary | ICD-10-CM | POA: Diagnosis not present

## 2021-04-13 DIAGNOSIS — M542 Cervicalgia: Secondary | ICD-10-CM | POA: Diagnosis not present

## 2021-04-17 DIAGNOSIS — M5416 Radiculopathy, lumbar region: Secondary | ICD-10-CM | POA: Diagnosis not present

## 2021-04-17 DIAGNOSIS — M542 Cervicalgia: Secondary | ICD-10-CM | POA: Diagnosis not present

## 2021-04-17 DIAGNOSIS — M9901 Segmental and somatic dysfunction of cervical region: Secondary | ICD-10-CM | POA: Diagnosis not present

## 2021-04-17 DIAGNOSIS — M9903 Segmental and somatic dysfunction of lumbar region: Secondary | ICD-10-CM | POA: Diagnosis not present

## 2021-04-19 DIAGNOSIS — M5416 Radiculopathy, lumbar region: Secondary | ICD-10-CM | POA: Diagnosis not present

## 2021-04-19 DIAGNOSIS — M9903 Segmental and somatic dysfunction of lumbar region: Secondary | ICD-10-CM | POA: Diagnosis not present

## 2021-04-19 DIAGNOSIS — M9901 Segmental and somatic dysfunction of cervical region: Secondary | ICD-10-CM | POA: Diagnosis not present

## 2021-04-19 DIAGNOSIS — M542 Cervicalgia: Secondary | ICD-10-CM | POA: Diagnosis not present

## 2021-04-20 ENCOUNTER — Other Ambulatory Visit: Payer: Self-pay | Admitting: Physician Assistant

## 2021-04-20 DIAGNOSIS — I10 Essential (primary) hypertension: Secondary | ICD-10-CM

## 2021-04-25 DIAGNOSIS — M9901 Segmental and somatic dysfunction of cervical region: Secondary | ICD-10-CM | POA: Diagnosis not present

## 2021-04-25 DIAGNOSIS — M5416 Radiculopathy, lumbar region: Secondary | ICD-10-CM | POA: Diagnosis not present

## 2021-04-25 DIAGNOSIS — M9903 Segmental and somatic dysfunction of lumbar region: Secondary | ICD-10-CM | POA: Diagnosis not present

## 2021-04-25 DIAGNOSIS — M542 Cervicalgia: Secondary | ICD-10-CM | POA: Diagnosis not present

## 2021-04-27 ENCOUNTER — Encounter: Payer: 59 | Admitting: Family Medicine

## 2021-04-27 DIAGNOSIS — E785 Hyperlipidemia, unspecified: Secondary | ICD-10-CM | POA: Diagnosis not present

## 2021-04-27 DIAGNOSIS — Z0000 Encounter for general adult medical examination without abnormal findings: Secondary | ICD-10-CM | POA: Diagnosis not present

## 2021-04-27 DIAGNOSIS — E538 Deficiency of other specified B group vitamins: Secondary | ICD-10-CM | POA: Diagnosis not present

## 2021-04-27 DIAGNOSIS — M542 Cervicalgia: Secondary | ICD-10-CM | POA: Diagnosis not present

## 2021-04-27 DIAGNOSIS — M5416 Radiculopathy, lumbar region: Secondary | ICD-10-CM | POA: Diagnosis not present

## 2021-04-27 DIAGNOSIS — R739 Hyperglycemia, unspecified: Secondary | ICD-10-CM | POA: Diagnosis not present

## 2021-04-27 DIAGNOSIS — M9903 Segmental and somatic dysfunction of lumbar region: Secondary | ICD-10-CM | POA: Diagnosis not present

## 2021-04-27 DIAGNOSIS — E782 Mixed hyperlipidemia: Secondary | ICD-10-CM | POA: Diagnosis not present

## 2021-04-27 DIAGNOSIS — E669 Obesity, unspecified: Secondary | ICD-10-CM | POA: Diagnosis not present

## 2021-04-27 DIAGNOSIS — M9901 Segmental and somatic dysfunction of cervical region: Secondary | ICD-10-CM | POA: Diagnosis not present

## 2021-05-01 ENCOUNTER — Ambulatory Visit: Payer: Medicare HMO | Admitting: Family Medicine

## 2021-05-01 DIAGNOSIS — M9903 Segmental and somatic dysfunction of lumbar region: Secondary | ICD-10-CM | POA: Diagnosis not present

## 2021-05-01 DIAGNOSIS — M9901 Segmental and somatic dysfunction of cervical region: Secondary | ICD-10-CM | POA: Diagnosis not present

## 2021-05-01 DIAGNOSIS — M5416 Radiculopathy, lumbar region: Secondary | ICD-10-CM | POA: Diagnosis not present

## 2021-05-01 DIAGNOSIS — M542 Cervicalgia: Secondary | ICD-10-CM | POA: Diagnosis not present

## 2021-05-04 DIAGNOSIS — M9901 Segmental and somatic dysfunction of cervical region: Secondary | ICD-10-CM | POA: Diagnosis not present

## 2021-05-04 DIAGNOSIS — M542 Cervicalgia: Secondary | ICD-10-CM | POA: Diagnosis not present

## 2021-05-04 DIAGNOSIS — M5416 Radiculopathy, lumbar region: Secondary | ICD-10-CM | POA: Diagnosis not present

## 2021-05-04 DIAGNOSIS — M9903 Segmental and somatic dysfunction of lumbar region: Secondary | ICD-10-CM | POA: Diagnosis not present

## 2021-05-10 DIAGNOSIS — D3614 Benign neoplasm of peripheral nerves and autonomic nervous system of thorax: Secondary | ICD-10-CM | POA: Diagnosis not present

## 2021-05-10 DIAGNOSIS — M9901 Segmental and somatic dysfunction of cervical region: Secondary | ICD-10-CM | POA: Diagnosis not present

## 2021-05-10 DIAGNOSIS — M9903 Segmental and somatic dysfunction of lumbar region: Secondary | ICD-10-CM | POA: Diagnosis not present

## 2021-05-10 DIAGNOSIS — L814 Other melanin hyperpigmentation: Secondary | ICD-10-CM | POA: Diagnosis not present

## 2021-05-10 DIAGNOSIS — M542 Cervicalgia: Secondary | ICD-10-CM | POA: Diagnosis not present

## 2021-05-10 DIAGNOSIS — D2272 Melanocytic nevi of left lower limb, including hip: Secondary | ICD-10-CM | POA: Diagnosis not present

## 2021-05-10 DIAGNOSIS — Z85828 Personal history of other malignant neoplasm of skin: Secondary | ICD-10-CM | POA: Diagnosis not present

## 2021-05-10 DIAGNOSIS — D2261 Melanocytic nevi of right upper limb, including shoulder: Secondary | ICD-10-CM | POA: Diagnosis not present

## 2021-05-10 DIAGNOSIS — D225 Melanocytic nevi of trunk: Secondary | ICD-10-CM | POA: Diagnosis not present

## 2021-05-10 DIAGNOSIS — M5416 Radiculopathy, lumbar region: Secondary | ICD-10-CM | POA: Diagnosis not present

## 2021-05-17 DIAGNOSIS — M9901 Segmental and somatic dysfunction of cervical region: Secondary | ICD-10-CM | POA: Diagnosis not present

## 2021-05-17 DIAGNOSIS — M542 Cervicalgia: Secondary | ICD-10-CM | POA: Diagnosis not present

## 2021-05-17 DIAGNOSIS — M9903 Segmental and somatic dysfunction of lumbar region: Secondary | ICD-10-CM | POA: Diagnosis not present

## 2021-05-17 DIAGNOSIS — M5416 Radiculopathy, lumbar region: Secondary | ICD-10-CM | POA: Diagnosis not present

## 2021-05-24 DIAGNOSIS — M9901 Segmental and somatic dysfunction of cervical region: Secondary | ICD-10-CM | POA: Diagnosis not present

## 2021-05-24 DIAGNOSIS — M5416 Radiculopathy, lumbar region: Secondary | ICD-10-CM | POA: Diagnosis not present

## 2021-05-24 DIAGNOSIS — M9903 Segmental and somatic dysfunction of lumbar region: Secondary | ICD-10-CM | POA: Diagnosis not present

## 2021-05-24 DIAGNOSIS — M542 Cervicalgia: Secondary | ICD-10-CM | POA: Diagnosis not present

## 2021-06-07 ENCOUNTER — Other Ambulatory Visit: Payer: Self-pay

## 2021-06-07 DIAGNOSIS — M542 Cervicalgia: Secondary | ICD-10-CM | POA: Diagnosis not present

## 2021-06-07 DIAGNOSIS — M9901 Segmental and somatic dysfunction of cervical region: Secondary | ICD-10-CM | POA: Diagnosis not present

## 2021-06-07 DIAGNOSIS — Z1231 Encounter for screening mammogram for malignant neoplasm of breast: Secondary | ICD-10-CM

## 2021-06-07 DIAGNOSIS — D229 Melanocytic nevi, unspecified: Secondary | ICD-10-CM | POA: Diagnosis not present

## 2021-06-07 DIAGNOSIS — M5416 Radiculopathy, lumbar region: Secondary | ICD-10-CM | POA: Diagnosis not present

## 2021-06-07 DIAGNOSIS — Z124 Encounter for screening for malignant neoplasm of cervix: Secondary | ICD-10-CM | POA: Diagnosis not present

## 2021-06-07 DIAGNOSIS — M9903 Segmental and somatic dysfunction of lumbar region: Secondary | ICD-10-CM | POA: Diagnosis not present

## 2021-06-26 DIAGNOSIS — M5416 Radiculopathy, lumbar region: Secondary | ICD-10-CM | POA: Diagnosis not present

## 2021-06-26 DIAGNOSIS — M9903 Segmental and somatic dysfunction of lumbar region: Secondary | ICD-10-CM | POA: Diagnosis not present

## 2021-06-26 DIAGNOSIS — M9901 Segmental and somatic dysfunction of cervical region: Secondary | ICD-10-CM | POA: Diagnosis not present

## 2021-06-26 DIAGNOSIS — M542 Cervicalgia: Secondary | ICD-10-CM | POA: Diagnosis not present

## 2021-07-17 DIAGNOSIS — M9903 Segmental and somatic dysfunction of lumbar region: Secondary | ICD-10-CM | POA: Diagnosis not present

## 2021-07-17 DIAGNOSIS — M542 Cervicalgia: Secondary | ICD-10-CM | POA: Diagnosis not present

## 2021-07-17 DIAGNOSIS — M9901 Segmental and somatic dysfunction of cervical region: Secondary | ICD-10-CM | POA: Diagnosis not present

## 2021-07-17 DIAGNOSIS — M5416 Radiculopathy, lumbar region: Secondary | ICD-10-CM | POA: Diagnosis not present

## 2021-08-07 DIAGNOSIS — M9901 Segmental and somatic dysfunction of cervical region: Secondary | ICD-10-CM | POA: Diagnosis not present

## 2021-08-07 DIAGNOSIS — M9903 Segmental and somatic dysfunction of lumbar region: Secondary | ICD-10-CM | POA: Diagnosis not present

## 2021-08-07 DIAGNOSIS — M542 Cervicalgia: Secondary | ICD-10-CM | POA: Diagnosis not present

## 2021-08-07 DIAGNOSIS — M5416 Radiculopathy, lumbar region: Secondary | ICD-10-CM | POA: Diagnosis not present

## 2021-08-20 IMAGING — MG MM DIGITAL SCREENING BILAT W/ TOMO AND CAD
8 of 14 series · 8 of 40 positions shown · non-contrast
Comparison: Previous exam(s).

CLINICAL DATA: Screening.

EXAM:
DIGITAL SCREENING BILATERAL MAMMOGRAM WITH TOMOSYNTHESIS AND CAD
TECHNIQUE: Bilateral screening digital craniocaudal and mediolateral oblique
mammograms were obtained. Bilateral screening digital breast
tomosynthesis was performed. The images were evaluated with
computer-aided detection.

[L MLO synth-2D (1 of 2)]
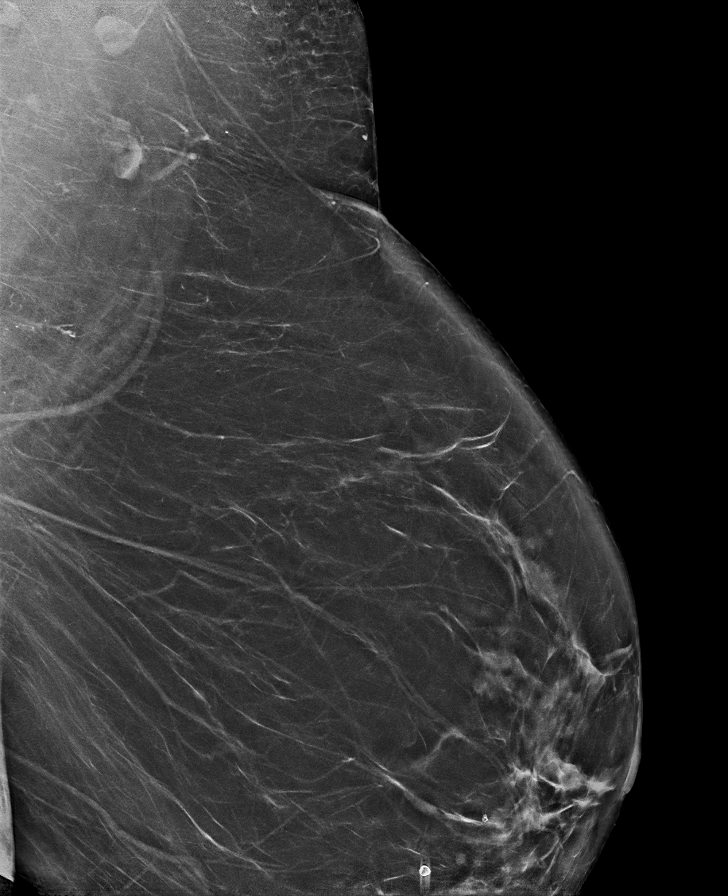

[L MLO synth-2D (2 of 2)]
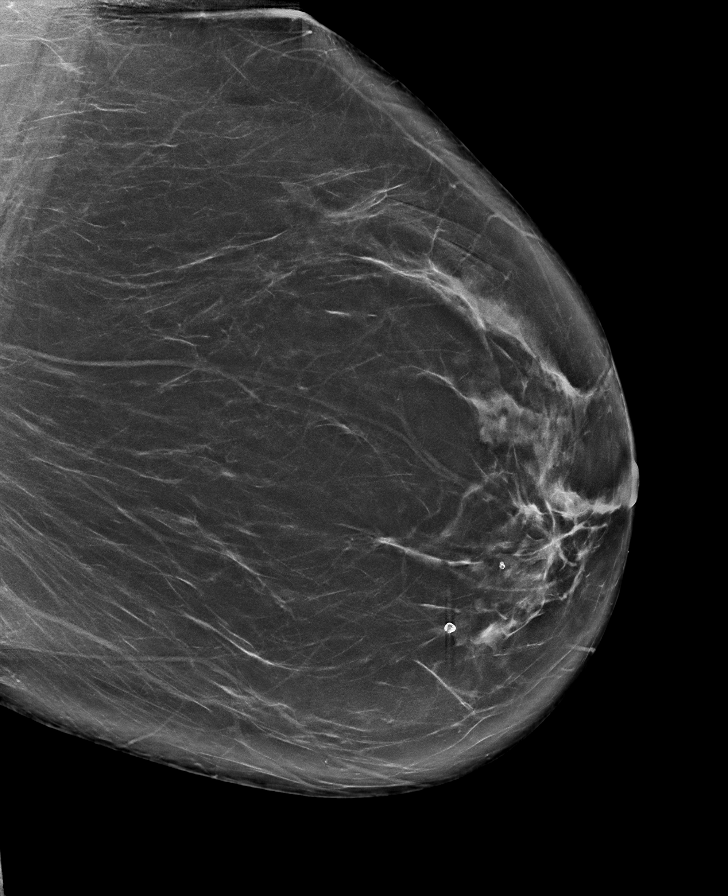

[L CV synth-2D]
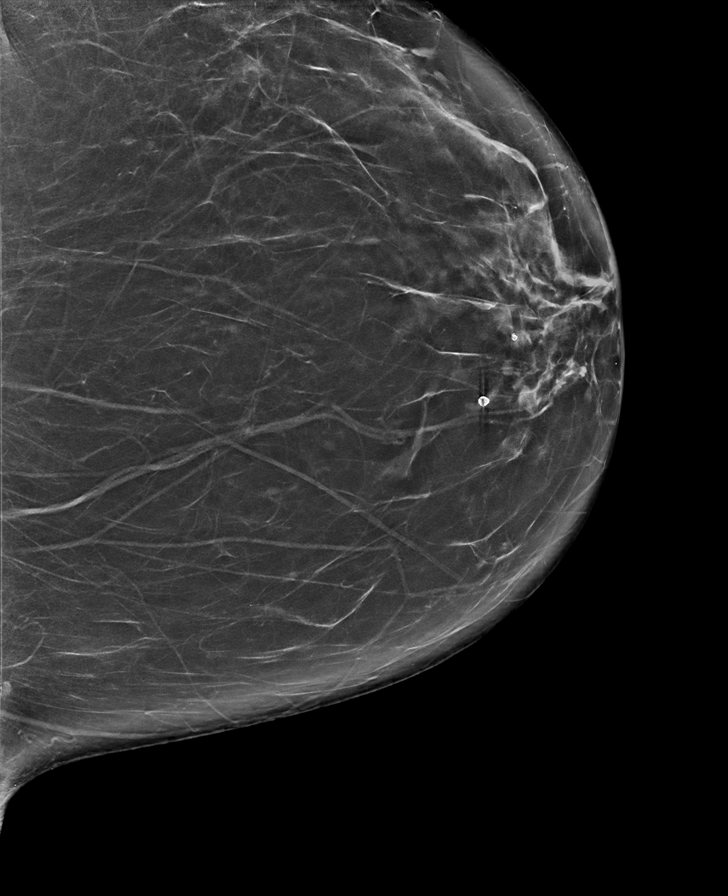

[R MLO synth-2D (1 of 2)]
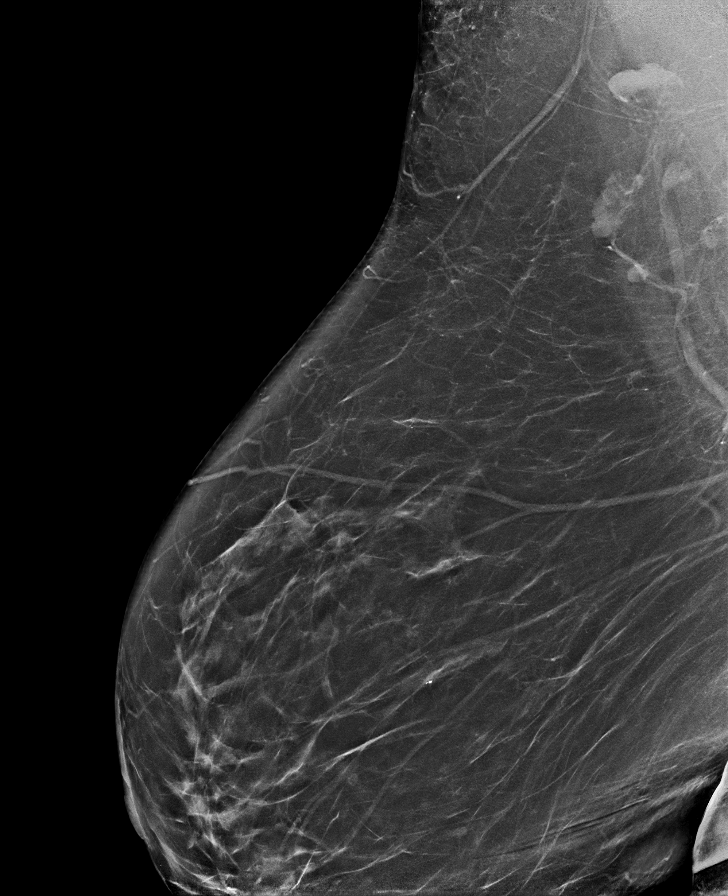

[R MLO synth-2D (2 of 2)]
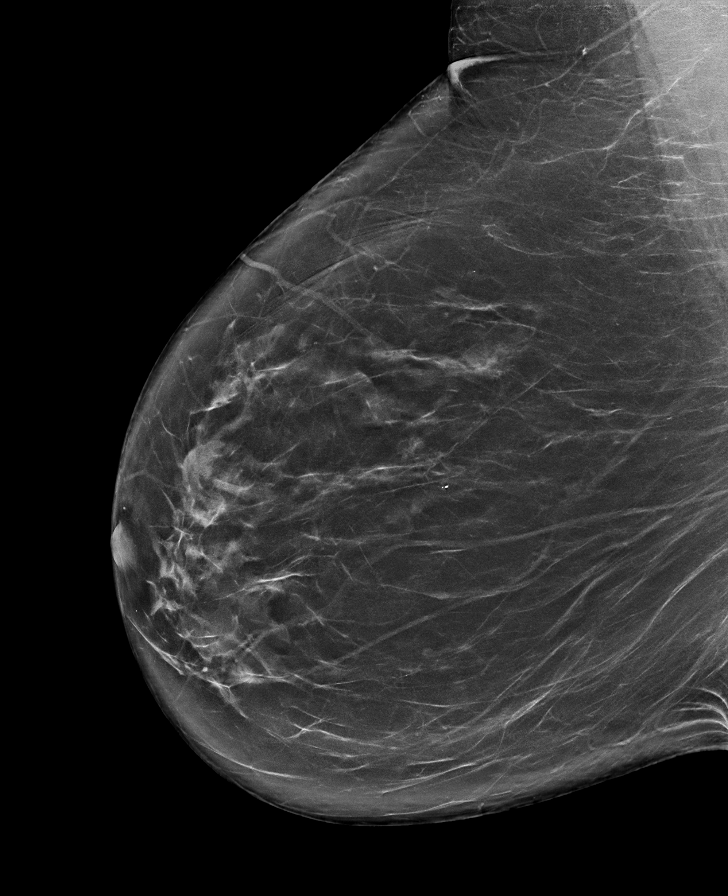

[L CC synth-2D]
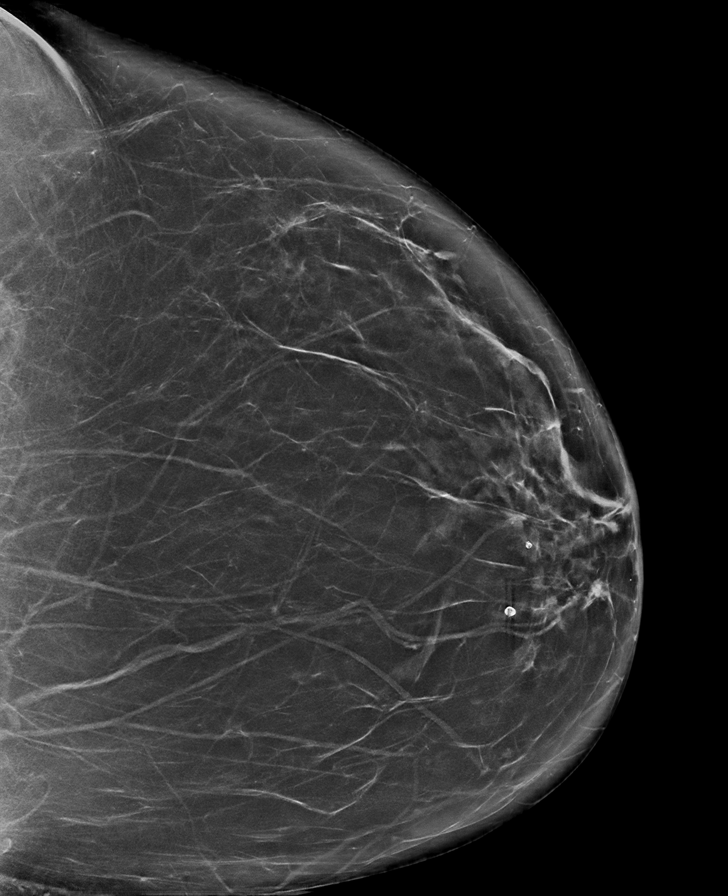

[R CC synth-2D]
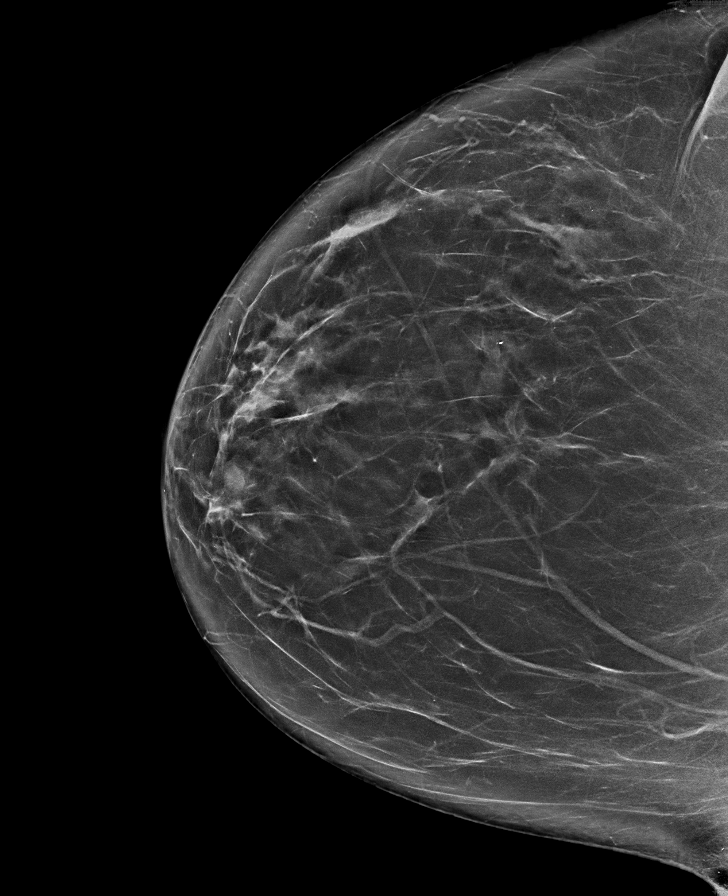

[L CV tomo · tomo slice 43/86.0]
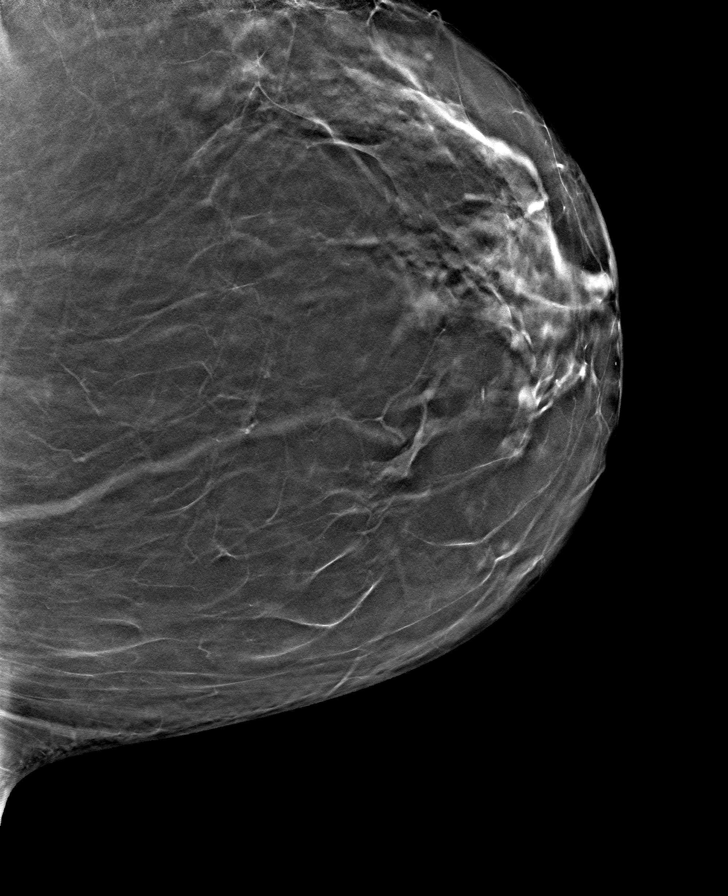

[8 of 40 positions shown; findings below may reference images not displayed]

ACR Breast Density Category b: There are scattered areas of
fibroglandular density.
FINDINGS: There are no findings suspicious for malignancy.
IMPRESSION: No mammographic evidence of malignancy. A result letter of this
screening mammogram will be mailed directly to the patient.

RECOMMENDATION:
Screening mammogram in one year. (Code:51-O-LD2)

BI-RADS CATEGORY  1: Negative.

## 2021-08-22 DIAGNOSIS — S5001XA Contusion of right elbow, initial encounter: Secondary | ICD-10-CM | POA: Diagnosis not present

## 2021-08-25 DIAGNOSIS — I10 Essential (primary) hypertension: Secondary | ICD-10-CM | POA: Diagnosis not present

## 2021-08-28 DIAGNOSIS — M9903 Segmental and somatic dysfunction of lumbar region: Secondary | ICD-10-CM | POA: Diagnosis not present

## 2021-08-28 DIAGNOSIS — M9901 Segmental and somatic dysfunction of cervical region: Secondary | ICD-10-CM | POA: Diagnosis not present

## 2021-08-28 DIAGNOSIS — M5416 Radiculopathy, lumbar region: Secondary | ICD-10-CM | POA: Diagnosis not present

## 2021-08-28 DIAGNOSIS — M542 Cervicalgia: Secondary | ICD-10-CM | POA: Diagnosis not present

## 2021-09-18 DIAGNOSIS — M9901 Segmental and somatic dysfunction of cervical region: Secondary | ICD-10-CM | POA: Diagnosis not present

## 2021-09-18 DIAGNOSIS — M9903 Segmental and somatic dysfunction of lumbar region: Secondary | ICD-10-CM | POA: Diagnosis not present

## 2021-09-18 DIAGNOSIS — M5416 Radiculopathy, lumbar region: Secondary | ICD-10-CM | POA: Diagnosis not present

## 2021-09-18 DIAGNOSIS — M542 Cervicalgia: Secondary | ICD-10-CM | POA: Diagnosis not present

## 2021-10-09 DIAGNOSIS — M9903 Segmental and somatic dysfunction of lumbar region: Secondary | ICD-10-CM | POA: Diagnosis not present

## 2021-10-09 DIAGNOSIS — M542 Cervicalgia: Secondary | ICD-10-CM | POA: Diagnosis not present

## 2021-10-09 DIAGNOSIS — M5416 Radiculopathy, lumbar region: Secondary | ICD-10-CM | POA: Diagnosis not present

## 2021-10-09 DIAGNOSIS — M9901 Segmental and somatic dysfunction of cervical region: Secondary | ICD-10-CM | POA: Diagnosis not present

## 2021-10-30 DIAGNOSIS — M9901 Segmental and somatic dysfunction of cervical region: Secondary | ICD-10-CM | POA: Diagnosis not present

## 2021-10-30 DIAGNOSIS — M9903 Segmental and somatic dysfunction of lumbar region: Secondary | ICD-10-CM | POA: Diagnosis not present

## 2021-10-30 DIAGNOSIS — M542 Cervicalgia: Secondary | ICD-10-CM | POA: Diagnosis not present

## 2021-10-30 DIAGNOSIS — M5416 Radiculopathy, lumbar region: Secondary | ICD-10-CM | POA: Diagnosis not present

## 2021-11-07 ENCOUNTER — Ambulatory Visit
Admission: RE | Admit: 2021-11-07 | Discharge: 2021-11-07 | Disposition: A | Discharge status: Home / Self Care | Payer: Medicare HMO | Source: Ambulatory Visit

## 2021-11-07 DIAGNOSIS — Z1231 Encounter for screening mammogram for malignant neoplasm of breast: Secondary | ICD-10-CM | POA: Insufficient documentation

## 2021-11-20 DIAGNOSIS — M9901 Segmental and somatic dysfunction of cervical region: Secondary | ICD-10-CM | POA: Diagnosis not present

## 2021-11-20 DIAGNOSIS — M5416 Radiculopathy, lumbar region: Secondary | ICD-10-CM | POA: Diagnosis not present

## 2021-11-20 DIAGNOSIS — M542 Cervicalgia: Secondary | ICD-10-CM | POA: Diagnosis not present

## 2021-11-20 DIAGNOSIS — M9903 Segmental and somatic dysfunction of lumbar region: Secondary | ICD-10-CM | POA: Diagnosis not present

## 2021-11-24 DIAGNOSIS — H26493 Other secondary cataract, bilateral: Secondary | ICD-10-CM | POA: Diagnosis not present

## 2021-12-11 DIAGNOSIS — M9901 Segmental and somatic dysfunction of cervical region: Secondary | ICD-10-CM | POA: Diagnosis not present

## 2021-12-11 DIAGNOSIS — M542 Cervicalgia: Secondary | ICD-10-CM | POA: Diagnosis not present

## 2021-12-11 DIAGNOSIS — M5416 Radiculopathy, lumbar region: Secondary | ICD-10-CM | POA: Diagnosis not present

## 2021-12-11 DIAGNOSIS — M9903 Segmental and somatic dysfunction of lumbar region: Secondary | ICD-10-CM | POA: Diagnosis not present

## 2021-12-25 DIAGNOSIS — M542 Cervicalgia: Secondary | ICD-10-CM | POA: Diagnosis not present

## 2021-12-25 DIAGNOSIS — M9901 Segmental and somatic dysfunction of cervical region: Secondary | ICD-10-CM | POA: Diagnosis not present

## 2021-12-25 DIAGNOSIS — M9903 Segmental and somatic dysfunction of lumbar region: Secondary | ICD-10-CM | POA: Diagnosis not present

## 2021-12-25 DIAGNOSIS — M5416 Radiculopathy, lumbar region: Secondary | ICD-10-CM | POA: Diagnosis not present

## 2022-01-08 DIAGNOSIS — M542 Cervicalgia: Secondary | ICD-10-CM | POA: Diagnosis not present

## 2022-01-08 DIAGNOSIS — M9903 Segmental and somatic dysfunction of lumbar region: Secondary | ICD-10-CM | POA: Diagnosis not present

## 2022-01-08 DIAGNOSIS — M5416 Radiculopathy, lumbar region: Secondary | ICD-10-CM | POA: Diagnosis not present

## 2022-01-08 DIAGNOSIS — M9901 Segmental and somatic dysfunction of cervical region: Secondary | ICD-10-CM | POA: Diagnosis not present

## 2022-01-22 DIAGNOSIS — M5416 Radiculopathy, lumbar region: Secondary | ICD-10-CM | POA: Diagnosis not present

## 2022-01-22 DIAGNOSIS — M9903 Segmental and somatic dysfunction of lumbar region: Secondary | ICD-10-CM | POA: Diagnosis not present

## 2022-01-22 DIAGNOSIS — M9901 Segmental and somatic dysfunction of cervical region: Secondary | ICD-10-CM | POA: Diagnosis not present

## 2022-01-22 DIAGNOSIS — M542 Cervicalgia: Secondary | ICD-10-CM | POA: Diagnosis not present

## 2022-02-12 ENCOUNTER — Encounter (INDEPENDENT_AMBULATORY_CARE_PROVIDER_SITE_OTHER): Payer: Self-pay

## 2022-02-13 DIAGNOSIS — M5416 Radiculopathy, lumbar region: Secondary | ICD-10-CM | POA: Diagnosis not present

## 2022-02-13 DIAGNOSIS — M9903 Segmental and somatic dysfunction of lumbar region: Secondary | ICD-10-CM | POA: Diagnosis not present

## 2022-02-13 DIAGNOSIS — M9901 Segmental and somatic dysfunction of cervical region: Secondary | ICD-10-CM | POA: Diagnosis not present

## 2022-02-13 DIAGNOSIS — M542 Cervicalgia: Secondary | ICD-10-CM | POA: Diagnosis not present

## 2022-02-14 DIAGNOSIS — S83412A Sprain of medial collateral ligament of left knee, initial encounter: Secondary | ICD-10-CM | POA: Diagnosis not present

## 2022-02-28 DIAGNOSIS — R739 Hyperglycemia, unspecified: Secondary | ICD-10-CM | POA: Diagnosis not present

## 2022-02-28 DIAGNOSIS — E782 Mixed hyperlipidemia: Secondary | ICD-10-CM | POA: Diagnosis not present

## 2022-02-28 DIAGNOSIS — E538 Deficiency of other specified B group vitamins: Secondary | ICD-10-CM | POA: Diagnosis not present

## 2022-03-01 DIAGNOSIS — E538 Deficiency of other specified B group vitamins: Secondary | ICD-10-CM | POA: Diagnosis not present

## 2022-03-01 DIAGNOSIS — E782 Mixed hyperlipidemia: Secondary | ICD-10-CM | POA: Diagnosis not present

## 2022-03-01 DIAGNOSIS — R739 Hyperglycemia, unspecified: Secondary | ICD-10-CM | POA: Diagnosis not present

## 2022-03-06 DIAGNOSIS — M542 Cervicalgia: Secondary | ICD-10-CM | POA: Diagnosis not present

## 2022-03-06 DIAGNOSIS — M9903 Segmental and somatic dysfunction of lumbar region: Secondary | ICD-10-CM | POA: Diagnosis not present

## 2022-03-06 DIAGNOSIS — M5416 Radiculopathy, lumbar region: Secondary | ICD-10-CM | POA: Diagnosis not present

## 2022-03-06 DIAGNOSIS — M9901 Segmental and somatic dysfunction of cervical region: Secondary | ICD-10-CM | POA: Diagnosis not present

## 2022-03-20 DIAGNOSIS — R300 Dysuria: Secondary | ICD-10-CM | POA: Diagnosis not present

## 2022-04-30 DIAGNOSIS — M542 Cervicalgia: Secondary | ICD-10-CM | POA: Diagnosis not present

## 2022-04-30 DIAGNOSIS — M9901 Segmental and somatic dysfunction of cervical region: Secondary | ICD-10-CM | POA: Diagnosis not present

## 2022-04-30 DIAGNOSIS — M9903 Segmental and somatic dysfunction of lumbar region: Secondary | ICD-10-CM | POA: Diagnosis not present

## 2022-04-30 DIAGNOSIS — M5416 Radiculopathy, lumbar region: Secondary | ICD-10-CM | POA: Diagnosis not present

## 2022-05-04 DIAGNOSIS — E669 Obesity, unspecified: Secondary | ICD-10-CM | POA: Diagnosis not present

## 2022-05-04 DIAGNOSIS — I10 Essential (primary) hypertension: Secondary | ICD-10-CM | POA: Diagnosis not present

## 2022-05-04 DIAGNOSIS — M5116 Intervertebral disc disorders with radiculopathy, lumbar region: Secondary | ICD-10-CM | POA: Diagnosis not present

## 2022-05-04 DIAGNOSIS — Z0000 Encounter for general adult medical examination without abnormal findings: Secondary | ICD-10-CM | POA: Diagnosis not present

## 2022-05-04 DIAGNOSIS — Z1212 Encounter for screening for malignant neoplasm of rectum: Secondary | ICD-10-CM | POA: Diagnosis not present

## 2022-05-09 DIAGNOSIS — D225 Melanocytic nevi of trunk: Secondary | ICD-10-CM | POA: Diagnosis not present

## 2022-05-09 DIAGNOSIS — D2262 Melanocytic nevi of left upper limb, including shoulder: Secondary | ICD-10-CM | POA: Diagnosis not present

## 2022-05-09 DIAGNOSIS — L821 Other seborrheic keratosis: Secondary | ICD-10-CM | POA: Diagnosis not present

## 2022-05-09 DIAGNOSIS — D2271 Melanocytic nevi of right lower limb, including hip: Secondary | ICD-10-CM | POA: Diagnosis not present

## 2022-05-09 DIAGNOSIS — L814 Other melanin hyperpigmentation: Secondary | ICD-10-CM | POA: Diagnosis not present

## 2022-05-09 DIAGNOSIS — D2261 Melanocytic nevi of right upper limb, including shoulder: Secondary | ICD-10-CM | POA: Diagnosis not present

## 2022-05-09 DIAGNOSIS — D2272 Melanocytic nevi of left lower limb, including hip: Secondary | ICD-10-CM | POA: Diagnosis not present

## 2022-05-09 DIAGNOSIS — B351 Tinea unguium: Secondary | ICD-10-CM | POA: Diagnosis not present

## 2022-05-09 DIAGNOSIS — L570 Actinic keratosis: Secondary | ICD-10-CM | POA: Diagnosis not present

## 2022-06-06 DIAGNOSIS — M5416 Radiculopathy, lumbar region: Secondary | ICD-10-CM | POA: Diagnosis not present

## 2022-06-06 DIAGNOSIS — M542 Cervicalgia: Secondary | ICD-10-CM | POA: Diagnosis not present

## 2022-06-06 DIAGNOSIS — M9903 Segmental and somatic dysfunction of lumbar region: Secondary | ICD-10-CM | POA: Diagnosis not present

## 2022-06-06 DIAGNOSIS — M9901 Segmental and somatic dysfunction of cervical region: Secondary | ICD-10-CM | POA: Diagnosis not present

## 2022-06-08 ENCOUNTER — Other Ambulatory Visit: Payer: Self-pay | Admitting: Internal Medicine

## 2022-06-08 DIAGNOSIS — Z1231 Encounter for screening mammogram for malignant neoplasm of breast: Secondary | ICD-10-CM

## 2022-06-12 DIAGNOSIS — Z1339 Encounter for screening examination for other mental health and behavioral disorders: Secondary | ICD-10-CM | POA: Diagnosis not present

## 2022-06-12 DIAGNOSIS — Z1331 Encounter for screening for depression: Secondary | ICD-10-CM | POA: Diagnosis not present

## 2022-06-12 DIAGNOSIS — Z124 Encounter for screening for malignant neoplasm of cervix: Secondary | ICD-10-CM | POA: Diagnosis not present

## 2022-07-04 DIAGNOSIS — M9901 Segmental and somatic dysfunction of cervical region: Secondary | ICD-10-CM | POA: Diagnosis not present

## 2022-07-04 DIAGNOSIS — M9903 Segmental and somatic dysfunction of lumbar region: Secondary | ICD-10-CM | POA: Diagnosis not present

## 2022-07-04 DIAGNOSIS — M5416 Radiculopathy, lumbar region: Secondary | ICD-10-CM | POA: Diagnosis not present

## 2022-07-04 DIAGNOSIS — M542 Cervicalgia: Secondary | ICD-10-CM | POA: Diagnosis not present

## 2022-07-31 DIAGNOSIS — M542 Cervicalgia: Secondary | ICD-10-CM | POA: Diagnosis not present

## 2022-07-31 DIAGNOSIS — M9901 Segmental and somatic dysfunction of cervical region: Secondary | ICD-10-CM | POA: Diagnosis not present

## 2022-07-31 DIAGNOSIS — M5416 Radiculopathy, lumbar region: Secondary | ICD-10-CM | POA: Diagnosis not present

## 2022-07-31 DIAGNOSIS — M9903 Segmental and somatic dysfunction of lumbar region: Secondary | ICD-10-CM | POA: Diagnosis not present

## 2022-08-28 DIAGNOSIS — M9903 Segmental and somatic dysfunction of lumbar region: Secondary | ICD-10-CM | POA: Diagnosis not present

## 2022-08-28 DIAGNOSIS — M5416 Radiculopathy, lumbar region: Secondary | ICD-10-CM | POA: Diagnosis not present

## 2022-08-28 DIAGNOSIS — M9901 Segmental and somatic dysfunction of cervical region: Secondary | ICD-10-CM | POA: Diagnosis not present

## 2022-08-28 DIAGNOSIS — M542 Cervicalgia: Secondary | ICD-10-CM | POA: Diagnosis not present

## 2022-10-02 DIAGNOSIS — M9901 Segmental and somatic dysfunction of cervical region: Secondary | ICD-10-CM | POA: Diagnosis not present

## 2022-10-02 DIAGNOSIS — M5416 Radiculopathy, lumbar region: Secondary | ICD-10-CM | POA: Diagnosis not present

## 2022-10-02 DIAGNOSIS — M9903 Segmental and somatic dysfunction of lumbar region: Secondary | ICD-10-CM | POA: Diagnosis not present

## 2022-10-02 DIAGNOSIS — M542 Cervicalgia: Secondary | ICD-10-CM | POA: Diagnosis not present

## 2022-10-05 ENCOUNTER — Ambulatory Visit: Payer: Medicare HMO

## 2022-10-05 DIAGNOSIS — Z86010 Personal history of colonic polyps: Secondary | ICD-10-CM | POA: Diagnosis not present

## 2022-10-05 DIAGNOSIS — Z09 Encounter for follow-up examination after completed treatment for conditions other than malignant neoplasm: Secondary | ICD-10-CM | POA: Diagnosis not present

## 2022-10-05 DIAGNOSIS — Z1211 Encounter for screening for malignant neoplasm of colon: Secondary | ICD-10-CM | POA: Diagnosis not present

## 2022-10-05 DIAGNOSIS — K640 First degree hemorrhoids: Secondary | ICD-10-CM | POA: Diagnosis not present

## 2022-10-05 DIAGNOSIS — K5730 Diverticulosis of large intestine without perforation or abscess without bleeding: Secondary | ICD-10-CM | POA: Diagnosis not present

## 2022-10-30 DIAGNOSIS — M542 Cervicalgia: Secondary | ICD-10-CM | POA: Diagnosis not present

## 2022-10-30 DIAGNOSIS — M9903 Segmental and somatic dysfunction of lumbar region: Secondary | ICD-10-CM | POA: Diagnosis not present

## 2022-10-30 DIAGNOSIS — M5416 Radiculopathy, lumbar region: Secondary | ICD-10-CM | POA: Diagnosis not present

## 2022-10-30 DIAGNOSIS — M9901 Segmental and somatic dysfunction of cervical region: Secondary | ICD-10-CM | POA: Diagnosis not present

## 2022-11-09 ENCOUNTER — Ambulatory Visit: Admission: RE | Admit: 2022-11-09 | Payer: Medicare HMO | Source: Ambulatory Visit

## 2022-11-09 DIAGNOSIS — Z1231 Encounter for screening mammogram for malignant neoplasm of breast: Secondary | ICD-10-CM

## 2022-11-14 DIAGNOSIS — M79672 Pain in left foot: Secondary | ICD-10-CM | POA: Diagnosis not present

## 2022-11-23 DIAGNOSIS — M79672 Pain in left foot: Secondary | ICD-10-CM | POA: Diagnosis not present

## 2022-11-26 DIAGNOSIS — Z961 Presence of intraocular lens: Secondary | ICD-10-CM | POA: Diagnosis not present

## 2022-11-26 DIAGNOSIS — H26491 Other secondary cataract, right eye: Secondary | ICD-10-CM | POA: Diagnosis not present

## 2022-11-26 DIAGNOSIS — H04123 Dry eye syndrome of bilateral lacrimal glands: Secondary | ICD-10-CM | POA: Diagnosis not present

## 2022-11-27 DIAGNOSIS — M542 Cervicalgia: Secondary | ICD-10-CM | POA: Diagnosis not present

## 2022-11-27 DIAGNOSIS — M79672 Pain in left foot: Secondary | ICD-10-CM | POA: Diagnosis not present

## 2022-11-27 DIAGNOSIS — M9901 Segmental and somatic dysfunction of cervical region: Secondary | ICD-10-CM | POA: Diagnosis not present

## 2022-11-27 DIAGNOSIS — M9903 Segmental and somatic dysfunction of lumbar region: Secondary | ICD-10-CM | POA: Diagnosis not present

## 2022-11-27 DIAGNOSIS — M5416 Radiculopathy, lumbar region: Secondary | ICD-10-CM | POA: Diagnosis not present

## 2022-12-25 DIAGNOSIS — M9901 Segmental and somatic dysfunction of cervical region: Secondary | ICD-10-CM | POA: Diagnosis not present

## 2022-12-25 DIAGNOSIS — M542 Cervicalgia: Secondary | ICD-10-CM | POA: Diagnosis not present

## 2022-12-25 DIAGNOSIS — M5416 Radiculopathy, lumbar region: Secondary | ICD-10-CM | POA: Diagnosis not present

## 2022-12-25 DIAGNOSIS — M9903 Segmental and somatic dysfunction of lumbar region: Secondary | ICD-10-CM | POA: Diagnosis not present

## 2023-01-17 DIAGNOSIS — H26491 Other secondary cataract, right eye: Secondary | ICD-10-CM | POA: Diagnosis not present

## 2023-01-24 DIAGNOSIS — M542 Cervicalgia: Secondary | ICD-10-CM | POA: Diagnosis not present

## 2023-01-24 DIAGNOSIS — M5416 Radiculopathy, lumbar region: Secondary | ICD-10-CM | POA: Diagnosis not present

## 2023-01-24 DIAGNOSIS — M9903 Segmental and somatic dysfunction of lumbar region: Secondary | ICD-10-CM | POA: Diagnosis not present

## 2023-01-24 DIAGNOSIS — M9901 Segmental and somatic dysfunction of cervical region: Secondary | ICD-10-CM | POA: Diagnosis not present

## 2023-02-08 DIAGNOSIS — H26491 Other secondary cataract, right eye: Secondary | ICD-10-CM | POA: Diagnosis not present

## 2023-02-28 DIAGNOSIS — M542 Cervicalgia: Secondary | ICD-10-CM | POA: Diagnosis not present

## 2023-02-28 DIAGNOSIS — M9901 Segmental and somatic dysfunction of cervical region: Secondary | ICD-10-CM | POA: Diagnosis not present

## 2023-02-28 DIAGNOSIS — M5416 Radiculopathy, lumbar region: Secondary | ICD-10-CM | POA: Diagnosis not present

## 2023-02-28 DIAGNOSIS — M9903 Segmental and somatic dysfunction of lumbar region: Secondary | ICD-10-CM | POA: Diagnosis not present

## 2023-03-28 DIAGNOSIS — M9901 Segmental and somatic dysfunction of cervical region: Secondary | ICD-10-CM | POA: Diagnosis not present

## 2023-03-28 DIAGNOSIS — M5416 Radiculopathy, lumbar region: Secondary | ICD-10-CM | POA: Diagnosis not present

## 2023-03-28 DIAGNOSIS — M542 Cervicalgia: Secondary | ICD-10-CM | POA: Diagnosis not present

## 2023-03-28 DIAGNOSIS — M9903 Segmental and somatic dysfunction of lumbar region: Secondary | ICD-10-CM | POA: Diagnosis not present

## 2023-04-18 ENCOUNTER — Other Ambulatory Visit: Payer: Self-pay | Admitting: Internal Medicine

## 2023-04-18 DIAGNOSIS — Z1231 Encounter for screening mammogram for malignant neoplasm of breast: Secondary | ICD-10-CM

## 2023-04-25 DIAGNOSIS — M542 Cervicalgia: Secondary | ICD-10-CM | POA: Diagnosis not present

## 2023-04-25 DIAGNOSIS — M9901 Segmental and somatic dysfunction of cervical region: Secondary | ICD-10-CM | POA: Diagnosis not present

## 2023-04-25 DIAGNOSIS — M5416 Radiculopathy, lumbar region: Secondary | ICD-10-CM | POA: Diagnosis not present

## 2023-04-25 DIAGNOSIS — M9903 Segmental and somatic dysfunction of lumbar region: Secondary | ICD-10-CM | POA: Diagnosis not present

## 2023-05-01 DIAGNOSIS — M542 Cervicalgia: Secondary | ICD-10-CM | POA: Diagnosis not present

## 2023-05-01 DIAGNOSIS — M5416 Radiculopathy, lumbar region: Secondary | ICD-10-CM | POA: Diagnosis not present

## 2023-05-01 DIAGNOSIS — M9901 Segmental and somatic dysfunction of cervical region: Secondary | ICD-10-CM | POA: Diagnosis not present

## 2023-05-01 DIAGNOSIS — M9903 Segmental and somatic dysfunction of lumbar region: Secondary | ICD-10-CM | POA: Diagnosis not present

## 2023-05-08 DIAGNOSIS — Z0000 Encounter for general adult medical examination without abnormal findings: Secondary | ICD-10-CM | POA: Diagnosis not present

## 2023-05-08 DIAGNOSIS — E785 Hyperlipidemia, unspecified: Secondary | ICD-10-CM | POA: Diagnosis not present

## 2023-05-15 DIAGNOSIS — D0439 Carcinoma in situ of skin of other parts of face: Secondary | ICD-10-CM | POA: Diagnosis not present

## 2023-05-15 DIAGNOSIS — D2262 Melanocytic nevi of left upper limb, including shoulder: Secondary | ICD-10-CM | POA: Diagnosis not present

## 2023-05-15 DIAGNOSIS — D2261 Melanocytic nevi of right upper limb, including shoulder: Secondary | ICD-10-CM | POA: Diagnosis not present

## 2023-05-15 DIAGNOSIS — L570 Actinic keratosis: Secondary | ICD-10-CM | POA: Diagnosis not present

## 2023-05-15 DIAGNOSIS — Z85828 Personal history of other malignant neoplasm of skin: Secondary | ICD-10-CM | POA: Diagnosis not present

## 2023-05-15 DIAGNOSIS — D2272 Melanocytic nevi of left lower limb, including hip: Secondary | ICD-10-CM | POA: Diagnosis not present

## 2023-05-15 DIAGNOSIS — L400 Psoriasis vulgaris: Secondary | ICD-10-CM | POA: Diagnosis not present

## 2023-05-15 DIAGNOSIS — D2271 Melanocytic nevi of right lower limb, including hip: Secondary | ICD-10-CM | POA: Diagnosis not present

## 2023-05-15 DIAGNOSIS — D485 Neoplasm of uncertain behavior of skin: Secondary | ICD-10-CM | POA: Diagnosis not present

## 2023-05-22 DIAGNOSIS — M5416 Radiculopathy, lumbar region: Secondary | ICD-10-CM | POA: Diagnosis not present

## 2023-05-22 DIAGNOSIS — M9901 Segmental and somatic dysfunction of cervical region: Secondary | ICD-10-CM | POA: Diagnosis not present

## 2023-05-22 DIAGNOSIS — M542 Cervicalgia: Secondary | ICD-10-CM | POA: Diagnosis not present

## 2023-05-22 DIAGNOSIS — M9903 Segmental and somatic dysfunction of lumbar region: Secondary | ICD-10-CM | POA: Diagnosis not present

## 2023-06-12 DIAGNOSIS — M542 Cervicalgia: Secondary | ICD-10-CM | POA: Diagnosis not present

## 2023-06-12 DIAGNOSIS — M9901 Segmental and somatic dysfunction of cervical region: Secondary | ICD-10-CM | POA: Diagnosis not present

## 2023-06-12 DIAGNOSIS — M9903 Segmental and somatic dysfunction of lumbar region: Secondary | ICD-10-CM | POA: Diagnosis not present

## 2023-06-12 DIAGNOSIS — M5416 Radiculopathy, lumbar region: Secondary | ICD-10-CM | POA: Diagnosis not present

## 2023-07-03 DIAGNOSIS — M542 Cervicalgia: Secondary | ICD-10-CM | POA: Diagnosis not present

## 2023-07-03 DIAGNOSIS — M9903 Segmental and somatic dysfunction of lumbar region: Secondary | ICD-10-CM | POA: Diagnosis not present

## 2023-07-03 DIAGNOSIS — M5416 Radiculopathy, lumbar region: Secondary | ICD-10-CM | POA: Diagnosis not present

## 2023-07-03 DIAGNOSIS — M9901 Segmental and somatic dysfunction of cervical region: Secondary | ICD-10-CM | POA: Diagnosis not present

## 2023-07-31 DIAGNOSIS — D485 Neoplasm of uncertain behavior of skin: Secondary | ICD-10-CM | POA: Diagnosis not present

## 2023-07-31 DIAGNOSIS — D0472 Carcinoma in situ of skin of left lower limb, including hip: Secondary | ICD-10-CM | POA: Diagnosis not present

## 2023-07-31 DIAGNOSIS — M542 Cervicalgia: Secondary | ICD-10-CM | POA: Diagnosis not present

## 2023-07-31 DIAGNOSIS — M9901 Segmental and somatic dysfunction of cervical region: Secondary | ICD-10-CM | POA: Diagnosis not present

## 2023-07-31 DIAGNOSIS — M9903 Segmental and somatic dysfunction of lumbar region: Secondary | ICD-10-CM | POA: Diagnosis not present

## 2023-07-31 DIAGNOSIS — D0439 Carcinoma in situ of skin of other parts of face: Secondary | ICD-10-CM | POA: Diagnosis not present

## 2023-07-31 DIAGNOSIS — M5416 Radiculopathy, lumbar region: Secondary | ICD-10-CM | POA: Diagnosis not present

## 2023-08-06 DIAGNOSIS — D0472 Carcinoma in situ of skin of left lower limb, including hip: Secondary | ICD-10-CM | POA: Diagnosis not present

## 2023-09-02 DIAGNOSIS — M542 Cervicalgia: Secondary | ICD-10-CM | POA: Diagnosis not present

## 2023-09-02 DIAGNOSIS — M9901 Segmental and somatic dysfunction of cervical region: Secondary | ICD-10-CM | POA: Diagnosis not present

## 2023-09-02 DIAGNOSIS — M9903 Segmental and somatic dysfunction of lumbar region: Secondary | ICD-10-CM | POA: Diagnosis not present

## 2023-09-02 DIAGNOSIS — M5416 Radiculopathy, lumbar region: Secondary | ICD-10-CM | POA: Diagnosis not present

## 2023-09-03 DIAGNOSIS — R599 Enlarged lymph nodes, unspecified: Secondary | ICD-10-CM | POA: Diagnosis not present

## 2023-09-03 DIAGNOSIS — J069 Acute upper respiratory infection, unspecified: Secondary | ICD-10-CM | POA: Diagnosis not present

## 2023-09-10 DIAGNOSIS — U071 COVID-19: Secondary | ICD-10-CM | POA: Diagnosis not present

## 2023-10-08 DIAGNOSIS — M5416 Radiculopathy, lumbar region: Secondary | ICD-10-CM | POA: Diagnosis not present

## 2023-10-08 DIAGNOSIS — M9901 Segmental and somatic dysfunction of cervical region: Secondary | ICD-10-CM | POA: Diagnosis not present

## 2023-10-08 DIAGNOSIS — M542 Cervicalgia: Secondary | ICD-10-CM | POA: Diagnosis not present

## 2023-10-08 DIAGNOSIS — M9903 Segmental and somatic dysfunction of lumbar region: Secondary | ICD-10-CM | POA: Diagnosis not present

## 2023-11-11 ENCOUNTER — Ambulatory Visit
Admission: RE | Admit: 2023-11-11 | Discharge: 2023-11-11 | Disposition: A | Discharge status: Home / Self Care | Payer: Medicare HMO | Source: Ambulatory Visit | Attending: Internal Medicine | Admitting: Internal Medicine

## 2023-11-11 DIAGNOSIS — Z1231 Encounter for screening mammogram for malignant neoplasm of breast: Secondary | ICD-10-CM | POA: Diagnosis not present

## 2023-11-12 DIAGNOSIS — M9903 Segmental and somatic dysfunction of lumbar region: Secondary | ICD-10-CM | POA: Diagnosis not present

## 2023-11-12 DIAGNOSIS — M9901 Segmental and somatic dysfunction of cervical region: Secondary | ICD-10-CM | POA: Diagnosis not present

## 2023-11-12 DIAGNOSIS — M5416 Radiculopathy, lumbar region: Secondary | ICD-10-CM | POA: Diagnosis not present

## 2023-11-12 DIAGNOSIS — M542 Cervicalgia: Secondary | ICD-10-CM | POA: Diagnosis not present

## 2023-12-06 DIAGNOSIS — H26491 Other secondary cataract, right eye: Secondary | ICD-10-CM | POA: Diagnosis not present

## 2023-12-06 DIAGNOSIS — Z961 Presence of intraocular lens: Secondary | ICD-10-CM | POA: Diagnosis not present

## 2023-12-10 DIAGNOSIS — M542 Cervicalgia: Secondary | ICD-10-CM | POA: Diagnosis not present

## 2023-12-10 DIAGNOSIS — M5416 Radiculopathy, lumbar region: Secondary | ICD-10-CM | POA: Diagnosis not present

## 2023-12-10 DIAGNOSIS — M9901 Segmental and somatic dysfunction of cervical region: Secondary | ICD-10-CM | POA: Diagnosis not present

## 2023-12-10 DIAGNOSIS — M9903 Segmental and somatic dysfunction of lumbar region: Secondary | ICD-10-CM | POA: Diagnosis not present

## 2024-01-08 DIAGNOSIS — M5416 Radiculopathy, lumbar region: Secondary | ICD-10-CM | POA: Diagnosis not present

## 2024-01-08 DIAGNOSIS — M9901 Segmental and somatic dysfunction of cervical region: Secondary | ICD-10-CM | POA: Diagnosis not present

## 2024-01-08 DIAGNOSIS — M542 Cervicalgia: Secondary | ICD-10-CM | POA: Diagnosis not present

## 2024-01-08 DIAGNOSIS — M9903 Segmental and somatic dysfunction of lumbar region: Secondary | ICD-10-CM | POA: Diagnosis not present

## 2024-02-04 DIAGNOSIS — R399 Unspecified symptoms and signs involving the genitourinary system: Secondary | ICD-10-CM | POA: Diagnosis not present

## 2024-02-12 DIAGNOSIS — Z85828 Personal history of other malignant neoplasm of skin: Secondary | ICD-10-CM | POA: Diagnosis not present

## 2024-02-12 DIAGNOSIS — D0462 Carcinoma in situ of skin of left upper limb, including shoulder: Secondary | ICD-10-CM | POA: Diagnosis not present

## 2024-02-12 DIAGNOSIS — D2272 Melanocytic nevi of left lower limb, including hip: Secondary | ICD-10-CM | POA: Diagnosis not present

## 2024-02-12 DIAGNOSIS — D225 Melanocytic nevi of trunk: Secondary | ICD-10-CM | POA: Diagnosis not present

## 2024-02-12 DIAGNOSIS — L565 Disseminated superficial actinic porokeratosis (DSAP): Secondary | ICD-10-CM | POA: Diagnosis not present

## 2024-02-12 DIAGNOSIS — D692 Other nonthrombocytopenic purpura: Secondary | ICD-10-CM | POA: Diagnosis not present

## 2024-02-12 DIAGNOSIS — D2261 Melanocytic nevi of right upper limb, including shoulder: Secondary | ICD-10-CM | POA: Diagnosis not present

## 2024-02-12 DIAGNOSIS — D485 Neoplasm of uncertain behavior of skin: Secondary | ICD-10-CM | POA: Diagnosis not present

## 2024-02-12 DIAGNOSIS — D2262 Melanocytic nevi of left upper limb, including shoulder: Secondary | ICD-10-CM | POA: Diagnosis not present

## 2024-02-12 DIAGNOSIS — D0471 Carcinoma in situ of skin of right lower limb, including hip: Secondary | ICD-10-CM | POA: Diagnosis not present

## 2024-02-13 DIAGNOSIS — M9903 Segmental and somatic dysfunction of lumbar region: Secondary | ICD-10-CM | POA: Diagnosis not present

## 2024-02-13 DIAGNOSIS — M5416 Radiculopathy, lumbar region: Secondary | ICD-10-CM | POA: Diagnosis not present

## 2024-02-13 DIAGNOSIS — M542 Cervicalgia: Secondary | ICD-10-CM | POA: Diagnosis not present

## 2024-02-13 DIAGNOSIS — M9901 Segmental and somatic dysfunction of cervical region: Secondary | ICD-10-CM | POA: Diagnosis not present

## 2024-03-10 DIAGNOSIS — M9903 Segmental and somatic dysfunction of lumbar region: Secondary | ICD-10-CM | POA: Diagnosis not present

## 2024-03-10 DIAGNOSIS — M5416 Radiculopathy, lumbar region: Secondary | ICD-10-CM | POA: Diagnosis not present

## 2024-03-10 DIAGNOSIS — M9901 Segmental and somatic dysfunction of cervical region: Secondary | ICD-10-CM | POA: Diagnosis not present

## 2024-03-10 DIAGNOSIS — M542 Cervicalgia: Secondary | ICD-10-CM | POA: Diagnosis not present

## 2024-04-01 DIAGNOSIS — D0471 Carcinoma in situ of skin of right lower limb, including hip: Secondary | ICD-10-CM | POA: Diagnosis not present

## 2024-04-01 DIAGNOSIS — D0462 Carcinoma in situ of skin of left upper limb, including shoulder: Secondary | ICD-10-CM | POA: Diagnosis not present
